# Patient Record
Sex: Female | Born: 1965 | Race: White | Hispanic: No | Smoking: Never smoker
Health system: Southern US, Community
[De-identification: ages and names within clinical notes are randomized; demographics above are authoritative.]

## PROBLEM LIST (undated history)

## (undated) DIAGNOSIS — I10 Essential (primary) hypertension: Secondary | ICD-10-CM

## (undated) DIAGNOSIS — K219 Gastro-esophageal reflux disease without esophagitis: Secondary | ICD-10-CM

## (undated) DIAGNOSIS — F419 Anxiety disorder, unspecified: Secondary | ICD-10-CM

## (undated) DIAGNOSIS — G43909 Migraine, unspecified, not intractable, without status migrainosus: Secondary | ICD-10-CM

## (undated) DIAGNOSIS — R011 Cardiac murmur, unspecified: Secondary | ICD-10-CM

## (undated) DIAGNOSIS — J329 Chronic sinusitis, unspecified: Secondary | ICD-10-CM

## (undated) DIAGNOSIS — A6000 Herpesviral infection of urogenital system, unspecified: Secondary | ICD-10-CM

## (undated) DIAGNOSIS — I499 Cardiac arrhythmia, unspecified: Secondary | ICD-10-CM

## (undated) DIAGNOSIS — A0472 Enterocolitis due to Clostridium difficile, not specified as recurrent: Secondary | ICD-10-CM

## (undated) DIAGNOSIS — F32A Depression, unspecified: Secondary | ICD-10-CM

## (undated) HISTORY — PX: BREAST CYST ASPIRATION: SHX578

## (undated) HISTORY — PX: UPPER GASTROINTESTINAL ENDOSCOPY: SHX188

## (undated) HISTORY — DX: Cardiac arrhythmia, unspecified: I49.9

## (undated) HISTORY — DX: Herpesviral infection of urogenital system, unspecified: A60.00

## (undated) HISTORY — DX: Cardiac murmur, unspecified: R01.1

## (undated) HISTORY — PX: COLONOSCOPY: SHX174

## (undated) HISTORY — DX: Migraine, unspecified, not intractable, without status migrainosus: G43.909

## (undated) HISTORY — DX: Essential (primary) hypertension: I10

## (undated) HISTORY — DX: Anxiety disorder, unspecified: F41.9

## (undated) HISTORY — DX: Gastro-esophageal reflux disease without esophagitis: K21.9

## (undated) HISTORY — DX: Enterocolitis due to Clostridium difficile, not specified as recurrent: A04.72

## (undated) HISTORY — DX: Chronic sinusitis, unspecified: J32.9

## (undated) HISTORY — DX: Depression, unspecified: F32.A

---

## 1998-05-09 ENCOUNTER — Other Ambulatory Visit: Admission: RE | Admit: 1998-05-09 | Discharge: 1998-05-09 | Payer: Self-pay | Admitting: Obstetrics and Gynecology

## 1999-06-13 ENCOUNTER — Other Ambulatory Visit: Admission: RE | Admit: 1999-06-13 | Discharge: 1999-06-13 | Payer: Self-pay | Admitting: Obstetrics and Gynecology

## 2000-01-07 ENCOUNTER — Encounter: Payer: Self-pay | Admitting: *Deleted

## 2000-01-07 ENCOUNTER — Encounter: Admission: RE | Admit: 2000-01-07 | Discharge: 2000-01-07 | Payer: Self-pay | Admitting: *Deleted

## 2000-06-15 ENCOUNTER — Other Ambulatory Visit: Admission: RE | Admit: 2000-06-15 | Discharge: 2000-06-15 | Payer: Self-pay | Admitting: Obstetrics and Gynecology

## 2000-10-06 ENCOUNTER — Encounter: Payer: Self-pay | Admitting: Obstetrics and Gynecology

## 2000-10-06 ENCOUNTER — Encounter: Admission: RE | Admit: 2000-10-06 | Discharge: 2000-10-06 | Payer: Self-pay | Admitting: Obstetrics and Gynecology

## 2001-06-21 ENCOUNTER — Other Ambulatory Visit: Admission: RE | Admit: 2001-06-21 | Discharge: 2001-06-21 | Payer: Self-pay | Admitting: Obstetrics and Gynecology

## 2001-11-05 ENCOUNTER — Encounter: Admission: RE | Admit: 2001-11-05 | Discharge: 2001-11-05 | Payer: Self-pay | Admitting: Obstetrics and Gynecology

## 2001-11-05 ENCOUNTER — Encounter: Payer: Self-pay | Admitting: Obstetrics and Gynecology

## 2002-10-07 ENCOUNTER — Encounter: Payer: Self-pay | Admitting: Obstetrics and Gynecology

## 2002-10-07 ENCOUNTER — Encounter: Admission: RE | Admit: 2002-10-07 | Discharge: 2002-10-07 | Payer: Self-pay | Admitting: Obstetrics and Gynecology

## 2002-11-16 ENCOUNTER — Other Ambulatory Visit: Admission: RE | Admit: 2002-11-16 | Discharge: 2002-11-16 | Payer: Self-pay | Admitting: Obstetrics and Gynecology

## 2003-02-14 ENCOUNTER — Encounter: Payer: Self-pay | Admitting: Obstetrics and Gynecology

## 2003-02-14 ENCOUNTER — Encounter: Admission: RE | Admit: 2003-02-14 | Discharge: 2003-02-14 | Payer: Self-pay | Admitting: Obstetrics and Gynecology

## 2006-03-23 ENCOUNTER — Encounter: Admission: RE | Admit: 2006-03-23 | Discharge: 2006-03-23 | Payer: Self-pay | Admitting: Specialist

## 2007-04-09 ENCOUNTER — Encounter: Admission: RE | Admit: 2007-04-09 | Discharge: 2007-04-09 | Payer: Self-pay | Admitting: Specialist

## 2008-03-25 ENCOUNTER — Emergency Department (HOSPITAL_COMMUNITY): Admission: EM | Admit: 2008-03-25 | Discharge: 2008-03-25 | Payer: Self-pay | Admitting: Emergency Medicine

## 2009-01-23 ENCOUNTER — Encounter: Admission: RE | Admit: 2009-01-23 | Discharge: 2009-01-23 | Payer: Self-pay | Admitting: Specialist

## 2010-01-31 ENCOUNTER — Encounter: Admission: RE | Admit: 2010-01-31 | Discharge: 2010-01-31 | Payer: Self-pay | Admitting: Specialist

## 2010-08-04 ENCOUNTER — Encounter: Payer: Self-pay | Admitting: Specialist

## 2011-02-04 ENCOUNTER — Other Ambulatory Visit: Payer: Self-pay | Admitting: Specialist

## 2011-02-04 DIAGNOSIS — Z1231 Encounter for screening mammogram for malignant neoplasm of breast: Secondary | ICD-10-CM

## 2011-02-19 ENCOUNTER — Ambulatory Visit
Admission: RE | Admit: 2011-02-19 | Discharge: 2011-02-19 | Disposition: A | Discharge status: Home / Self Care | Payer: BC Managed Care – PPO | Source: Ambulatory Visit | Attending: Specialist | Admitting: Specialist

## 2011-02-19 DIAGNOSIS — Z1231 Encounter for screening mammogram for malignant neoplasm of breast: Secondary | ICD-10-CM

## 2012-02-02 ENCOUNTER — Other Ambulatory Visit: Payer: Self-pay | Admitting: Specialist

## 2012-02-02 DIAGNOSIS — Z1231 Encounter for screening mammogram for malignant neoplasm of breast: Secondary | ICD-10-CM

## 2012-02-20 ENCOUNTER — Ambulatory Visit
Admission: RE | Admit: 2012-02-20 | Discharge: 2012-02-20 | Disposition: A | Discharge status: Home / Self Care | Payer: BC Managed Care – PPO | Source: Ambulatory Visit | Attending: Specialist | Admitting: Specialist

## 2012-02-20 DIAGNOSIS — Z1231 Encounter for screening mammogram for malignant neoplasm of breast: Secondary | ICD-10-CM

## 2013-02-07 ENCOUNTER — Other Ambulatory Visit: Payer: Self-pay

## 2013-02-07 DIAGNOSIS — Z1231 Encounter for screening mammogram for malignant neoplasm of breast: Secondary | ICD-10-CM

## 2013-06-10 ENCOUNTER — Ambulatory Visit: Payer: BC Managed Care – PPO

## 2017-10-19 ENCOUNTER — Other Ambulatory Visit: Payer: Self-pay | Admitting: Specialist

## 2017-10-19 DIAGNOSIS — Z139 Encounter for screening, unspecified: Secondary | ICD-10-CM

## 2017-10-21 ENCOUNTER — Ambulatory Visit
Admission: RE | Admit: 2017-10-21 | Discharge: 2017-10-21 | Disposition: A | Discharge status: Home / Self Care | Payer: BLUE CROSS/BLUE SHIELD | Source: Ambulatory Visit | Attending: Specialist | Admitting: Specialist

## 2017-10-21 DIAGNOSIS — Z139 Encounter for screening, unspecified: Secondary | ICD-10-CM

## 2018-01-05 ENCOUNTER — Other Ambulatory Visit: Payer: Self-pay | Admitting: Dentistry

## 2018-01-05 DIAGNOSIS — R29898 Other symptoms and signs involving the musculoskeletal system: Secondary | ICD-10-CM

## 2018-01-21 ENCOUNTER — Other Ambulatory Visit (HOSPITAL_COMMUNITY): Payer: Self-pay | Admitting: Dentistry

## 2018-01-21 DIAGNOSIS — R29898 Other symptoms and signs involving the musculoskeletal system: Secondary | ICD-10-CM

## 2018-01-29 ENCOUNTER — Ambulatory Visit (HOSPITAL_COMMUNITY)
Admission: RE | Admit: 2018-01-29 | Discharge: 2018-01-29 | Disposition: A | Discharge status: Home / Self Care | Payer: BLUE CROSS/BLUE SHIELD | Source: Ambulatory Visit | Attending: Dentistry | Admitting: Dentistry

## 2018-01-29 DIAGNOSIS — M2669 Other specified disorders of temporomandibular joint: Secondary | ICD-10-CM | POA: Insufficient documentation

## 2018-01-29 DIAGNOSIS — R29898 Other symptoms and signs involving the musculoskeletal system: Secondary | ICD-10-CM | POA: Diagnosis present

## 2018-01-29 DIAGNOSIS — S0301XA Dislocation of jaw, right side, initial encounter: Secondary | ICD-10-CM | POA: Insufficient documentation

## 2018-01-29 DIAGNOSIS — X58XXXA Exposure to other specified factors, initial encounter: Secondary | ICD-10-CM | POA: Diagnosis not present

## 2018-09-15 ENCOUNTER — Other Ambulatory Visit: Payer: Self-pay | Admitting: Family

## 2018-09-15 DIAGNOSIS — Z1231 Encounter for screening mammogram for malignant neoplasm of breast: Secondary | ICD-10-CM

## 2018-10-25 ENCOUNTER — Ambulatory Visit: Payer: BLUE CROSS/BLUE SHIELD

## 2019-01-24 ENCOUNTER — Ambulatory Visit
Admission: RE | Admit: 2019-01-24 | Discharge: 2019-01-24 | Disposition: A | Discharge status: Home / Self Care | Payer: BC Managed Care – PPO | Source: Ambulatory Visit | Attending: Family | Admitting: Family

## 2019-01-24 DIAGNOSIS — Z1231 Encounter for screening mammogram for malignant neoplasm of breast: Secondary | ICD-10-CM

## 2019-01-30 ENCOUNTER — Telehealth: Payer: BC Managed Care – PPO | Admitting: Nurse Practitioner

## 2019-01-30 DIAGNOSIS — Z20822 Contact with and (suspected) exposure to covid-19: Secondary | ICD-10-CM

## 2019-01-30 MED ORDER — ALBUTEROL SULFATE HFA 108 (90 BASE) MCG/ACT IN AERS: 2.0000 | INHALATION_SPRAY | Freq: Four times a day (QID) | RESPIRATORY_TRACT | 0 refills | Status: DC | PRN

## 2019-01-30 NOTE — Addendum Note (Signed)
Addended by: Chevis Pretty on: 01/30/2019 12:01 PM   Modules accepted: Orders

## 2019-01-30 NOTE — Progress Notes (Signed)
5-10 minutes spent reviewing and documenting in chart.  

## 2019-01-30 NOTE — Progress Notes (Signed)
E-Visit for Corona Virus Screening   Your current symptoms could be consistent with the coronavirus.  Many health care providers can now test patients at their office but not all are.  [facility] has multiple testing sites. For information on our COVID testing locations and hours go to [email or web address]  Please quarantine yourself while awaiting your test results.    COVID-19 is a respiratory illness with symptoms that aresimilar to the flu. Symptoms are typically mild to moderate, but there have been cases of severe illness and death due to the virus. The following symptoms may appear 2-14 days after exposure: Fever Cough Shortness of breath or difficulty breathing Chills Repeated shaking with chills Muscle pain Headache Sore throat New loss of taste or smell Fatigue Congestion or runny nose Nausea or vomiting Diarrhea  It is vitally important that if you feel that you have an infection such as this virus or any other virus that you stay home and away from places where you may spread it to others.  You should self-quarantine for 14 days if you have symptoms that could potentially be coronavirus or have been in close contact a with a person diagnosed with COVID-19 within the last 2 weeks. You should avoid contact with people age 53 and older.   You should wear a mask or cloth face covering over your nose and mouth if you must be around other people or animals, including pets (even at home). Try to stay at least 6 feet away from other people. This will protect the people around you.  You can use medication such as A prescription inhaler called Albuterol MDI 90 mcg /actuation 2 puffs every 4 hours as needed for shortness of breath, wheezing, cough  You may also take acetaminophen (Tylenol) as needed for fever.   Reduce your risk of any infection by using the same precautions used for avoiding the common cold or flu: Wash your hands often with  soap and warm water for at least 20 seconds.  If soap and water are not readily available, use an alcohol-based hand sanitizer with at least 60% alcohol.  If coughing or sneezing, cover your mouth and nose by coughing or sneezing into the elbow areas of your shirt or coat, into a tissue or into your sleeve (not your hands). Avoid shaking hands with others and consider head nods or verbal greetings only.  Avoid touching your eyes,nose, or mouth with unwashed hands. Avoid close contact with people who are sick. Avoid places or events with large numbers of people in one location, like concerts or sporting events. Carefully consider travel plans you have or are making. If you are planning any travel outside or inside the [place], visit the CDC'sTravelers' Health webpagefor the latest health notices. If you have some symptoms but not all symptoms, continue to monitor at home and seek medical attention if your symptoms worsen. If you are having a medical emergency, call 911.  HOME CARE Only take medications as instructed by your medical team. Drink plenty of fluids and get plenty of rest. A steam or ultrasonic humidifier can help if you have congestion.   GET HELP RIGHT AWAY IF YOU HAVE EMERGENCY WARNING SIGNS** FOR COVID-19. If you or someone is showing any of these signs seek emergency medical care immediately. Call 911 or proceed to your closest emergency facility if: You develop worsening high fever. Trouble breathing Bluish lips or face Persistent pain or pressure in the chest New confusion Inability to wake or stay awake You  cough up blood. Your symptoms become more severe  **This list is not all possible symptoms. Contact your medical provider for any symptoms that are sever or concerning to you.   MAKE SURE YOU  Understand these instructions. Will watch your condition. Will get help right away if you are not doing well or get worse.  Your e-visit answers were reviewed by a  board certified advanced clinical practitioner to complete your personal care plan.  Depending on the condition, your plan could have included both over the counter or prescription medications.  If there is a problem please reply once you have received a response from your provider.  Your safety is important to [place].  If you have drug allergies check your prescription carefully.    You can use MyChart to ask questions about today's visit, request a non-urgent call back, or ask for a work or school excuse for 24 hours related to this e-Visit. If it has been greater than 24 hours you will need to follow up with your provider, or enter a new e-Visit to address those concerns. You will get an e-mail in the next two days asking about your experience.  I hope that your e-visit has been valuable and will speed your recovery. Thank you for using e-visits.

## 2019-12-26 ENCOUNTER — Other Ambulatory Visit: Payer: Self-pay | Admitting: Family

## 2019-12-26 DIAGNOSIS — Z1231 Encounter for screening mammogram for malignant neoplasm of breast: Secondary | ICD-10-CM

## 2020-01-27 ENCOUNTER — Other Ambulatory Visit: Payer: Self-pay

## 2020-01-27 ENCOUNTER — Ambulatory Visit
Admission: RE | Admit: 2020-01-27 | Discharge: 2020-01-27 | Disposition: A | Discharge status: Home / Self Care | Payer: BC Managed Care – PPO | Source: Ambulatory Visit | Attending: Family | Admitting: Family

## 2020-01-27 DIAGNOSIS — Z1231 Encounter for screening mammogram for malignant neoplasm of breast: Secondary | ICD-10-CM

## 2020-07-16 IMAGING — MG DIGITAL SCREENING BILATERAL MAMMOGRAM WITH TOMO AND CAD
8 series · 8 of 24 positions shown · non-contrast
Comparison: Previous exam(s).

CLINICAL DATA: Screening.

EXAM:
DIGITAL SCREENING BILATERAL MAMMOGRAM WITH TOMO AND CAD

[R MLO synth-2D]
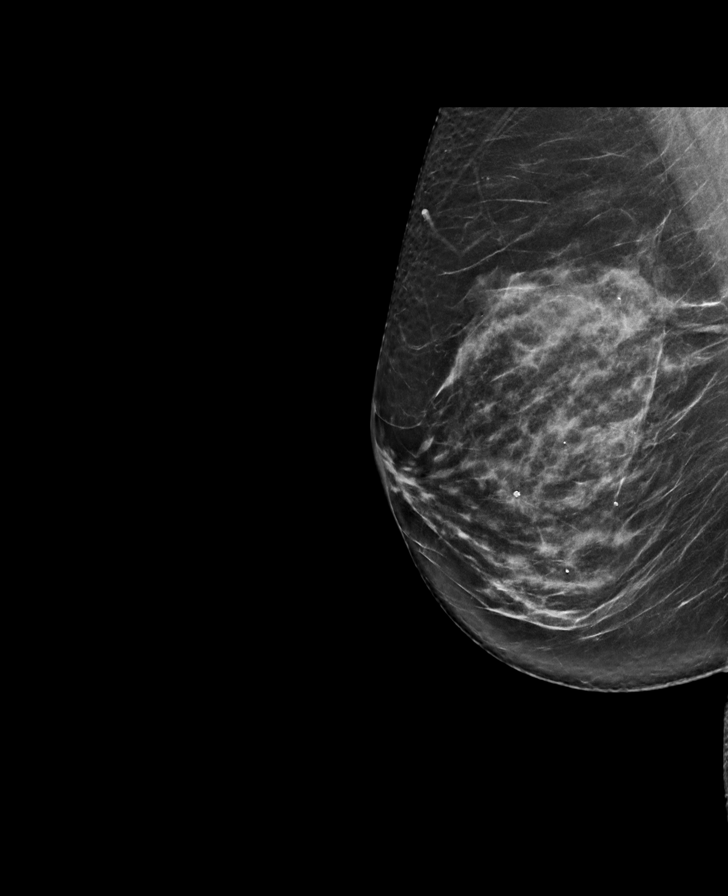

[L MLO synth-2D]
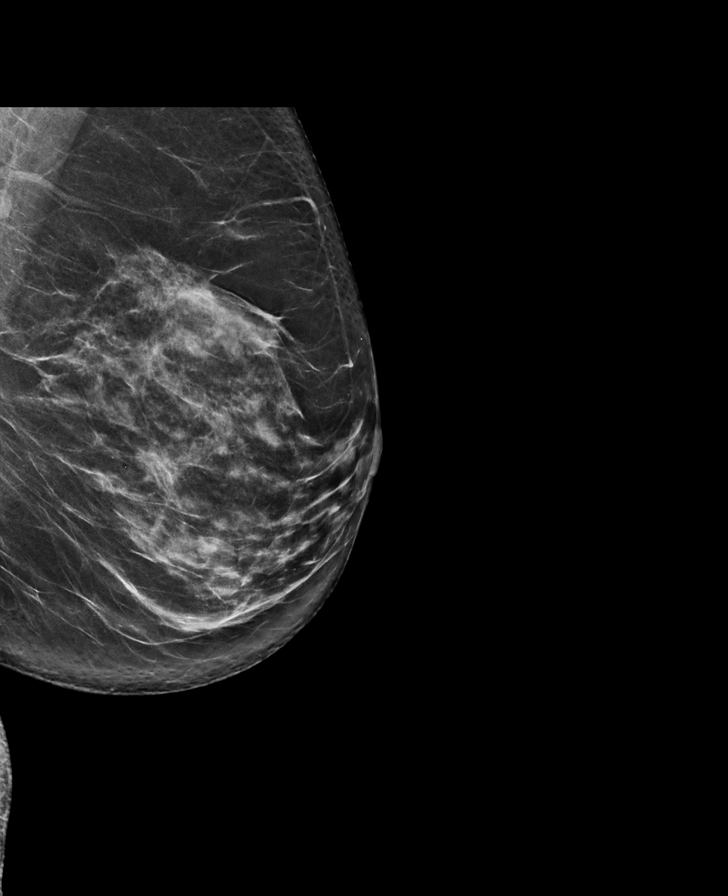

[R CC synth-2D]
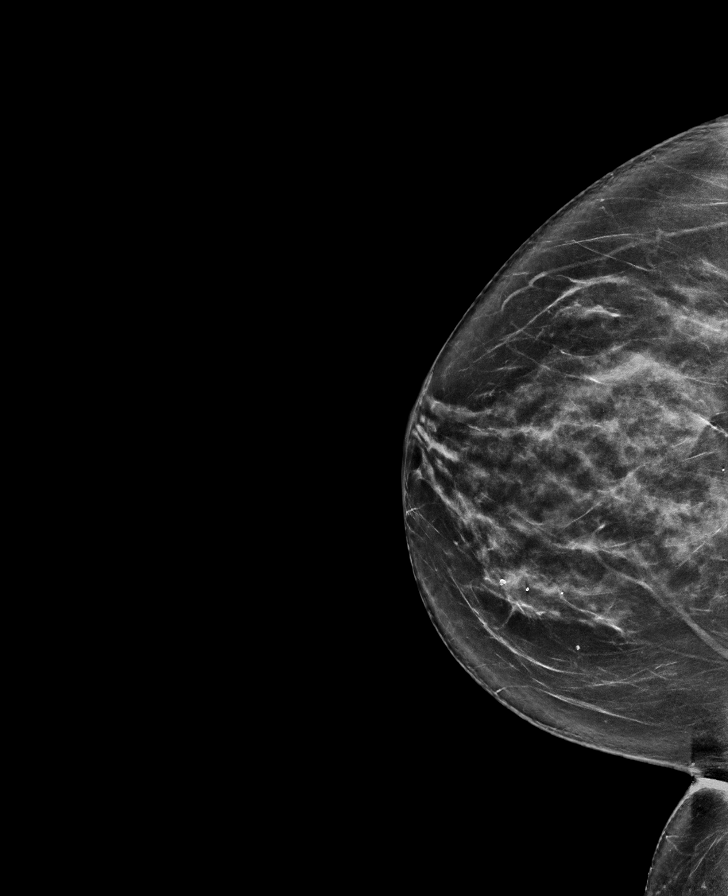

[L CC synth-2D]
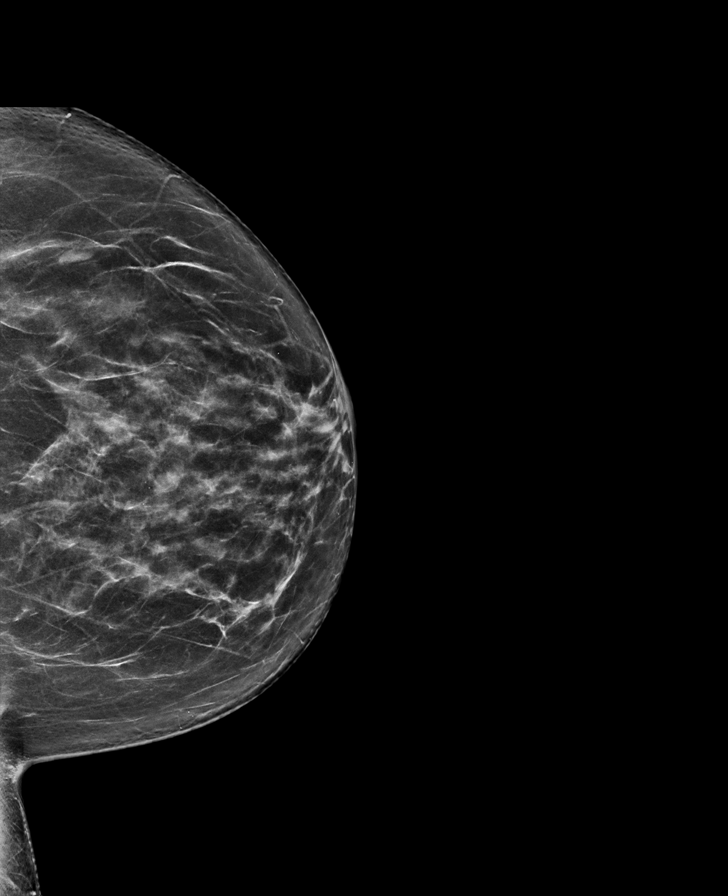

[R MLO tomo · tomo slice 41/81.0]
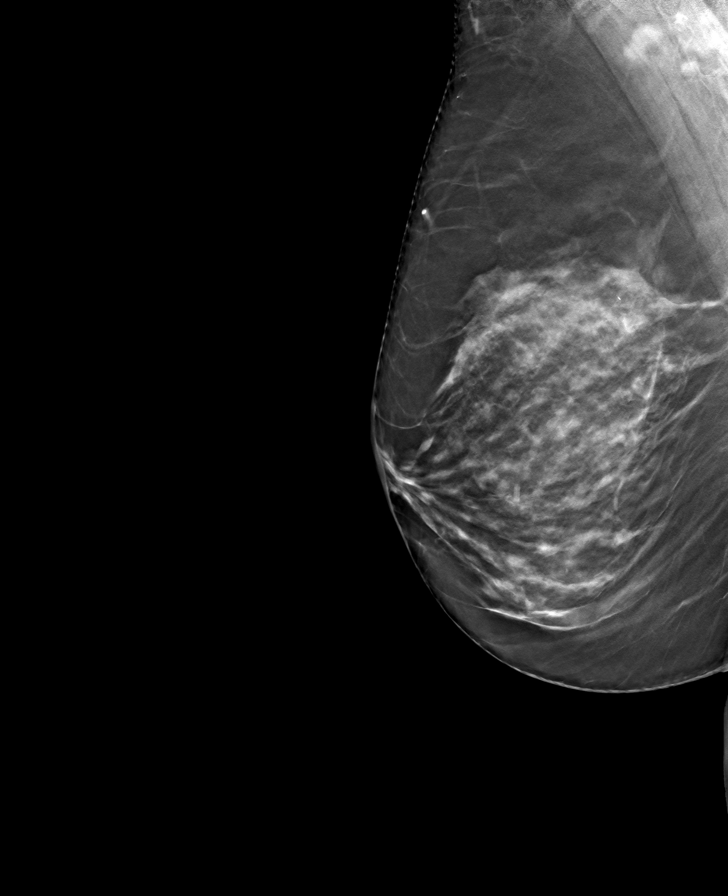

[R CC tomo · tomo slice 41/80.0]
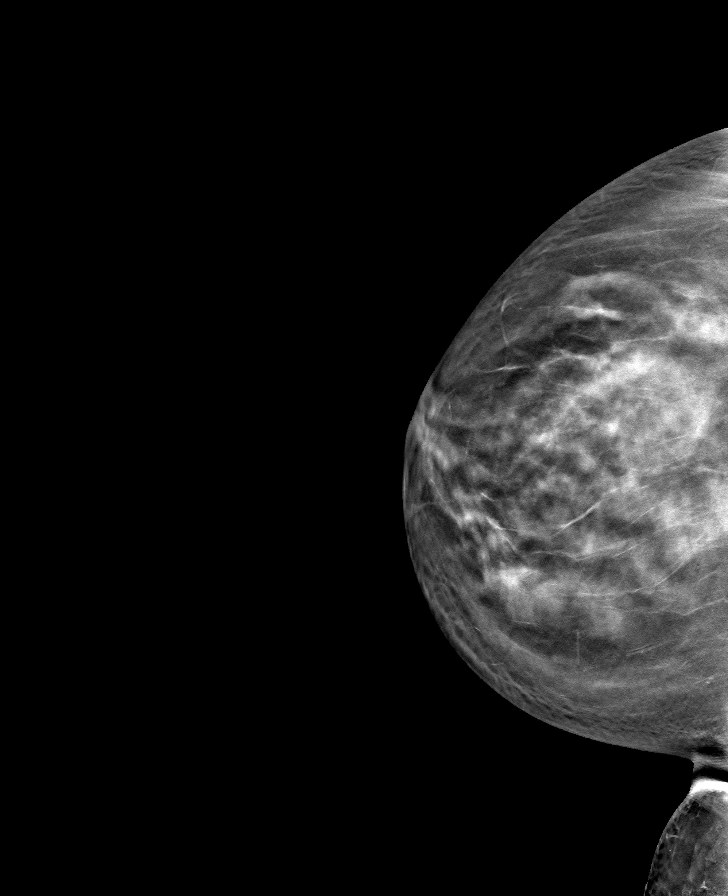

[L MLO tomo · tomo slice 41/82.0]
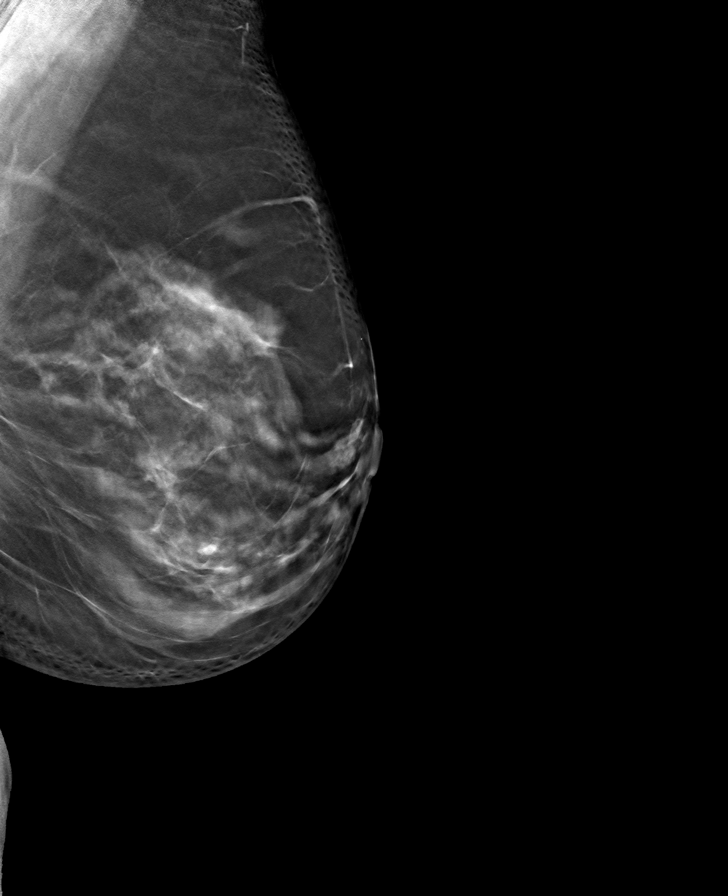

[L CC tomo · tomo slice 38/75.0]
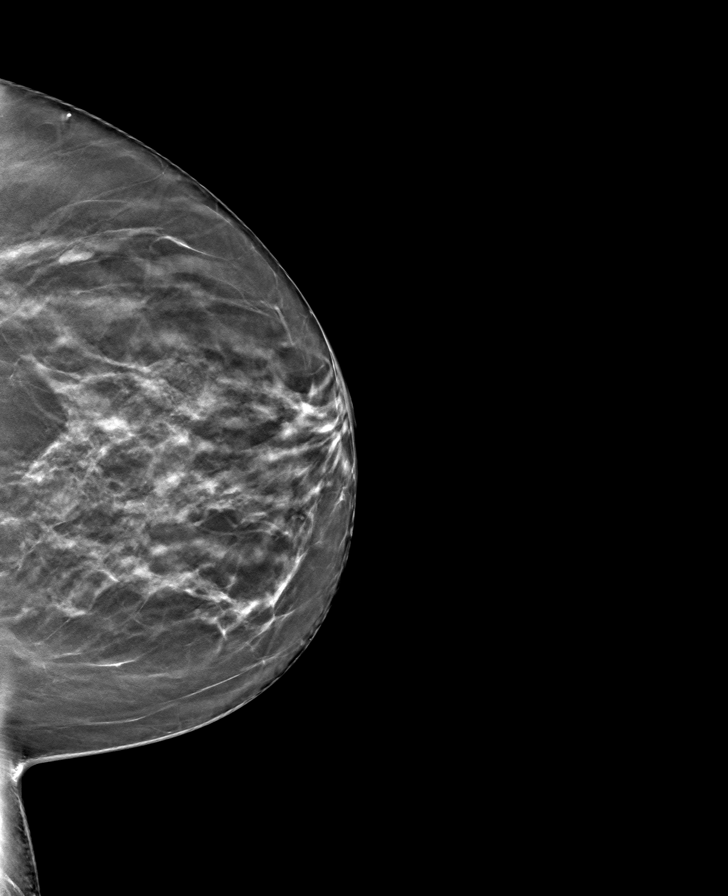

[8 of 24 positions shown; findings below may reference images not displayed]

ACR Breast Density Category c: The breast tissue is heterogeneously
dense, which may obscure small masses.
FINDINGS: There are no findings suspicious for malignancy. Images were
processed with CAD.
IMPRESSION: No mammographic evidence of malignancy. A result letter of this
screening mammogram will be mailed directly to the patient.

RECOMMENDATION:
Screening mammogram in one year. (Code:[id number])

BI-RADS CATEGORY  1: Negative.

## 2021-01-11 ENCOUNTER — Other Ambulatory Visit: Payer: Self-pay | Admitting: Family

## 2021-01-11 DIAGNOSIS — Z1231 Encounter for screening mammogram for malignant neoplasm of breast: Secondary | ICD-10-CM

## 2021-03-07 ENCOUNTER — Ambulatory Visit
Admission: RE | Admit: 2021-03-07 | Discharge: 2021-03-07 | Disposition: A | Discharge status: Home / Self Care | Payer: BC Managed Care – PPO | Source: Ambulatory Visit | Attending: Family | Admitting: Family

## 2021-03-07 ENCOUNTER — Other Ambulatory Visit: Payer: Self-pay

## 2021-03-07 DIAGNOSIS — Z1231 Encounter for screening mammogram for malignant neoplasm of breast: Secondary | ICD-10-CM

## 2022-01-11 HISTORY — PX: NASAL SINUS SURGERY: SHX719

## 2022-02-05 ENCOUNTER — Other Ambulatory Visit: Payer: Self-pay | Admitting: Family

## 2022-02-05 DIAGNOSIS — Z1231 Encounter for screening mammogram for malignant neoplasm of breast: Secondary | ICD-10-CM

## 2022-03-10 ENCOUNTER — Ambulatory Visit
Admission: RE | Admit: 2022-03-10 | Discharge: 2022-03-10 | Disposition: A | Discharge status: Home / Self Care | Payer: BC Managed Care – PPO | Source: Ambulatory Visit | Attending: Family | Admitting: Family

## 2022-03-10 DIAGNOSIS — Z1231 Encounter for screening mammogram for malignant neoplasm of breast: Secondary | ICD-10-CM

## 2022-03-14 ENCOUNTER — Emergency Department (HOSPITAL_BASED_OUTPATIENT_CLINIC_OR_DEPARTMENT_OTHER): Payer: BC Managed Care – PPO

## 2022-03-14 ENCOUNTER — Emergency Department (HOSPITAL_BASED_OUTPATIENT_CLINIC_OR_DEPARTMENT_OTHER)
Admission: EM | Admit: 2022-03-14 | Discharge: 2022-03-14 | Disposition: A | Discharge status: Home / Self Care | Payer: BC Managed Care – PPO | Attending: Emergency Medicine | Admitting: Emergency Medicine

## 2022-03-14 ENCOUNTER — Encounter (HOSPITAL_BASED_OUTPATIENT_CLINIC_OR_DEPARTMENT_OTHER): Payer: Self-pay | Admitting: Emergency Medicine

## 2022-03-14 ENCOUNTER — Other Ambulatory Visit: Payer: Self-pay

## 2022-03-14 DIAGNOSIS — I10 Essential (primary) hypertension: Secondary | ICD-10-CM | POA: Insufficient documentation

## 2022-03-14 DIAGNOSIS — U071 COVID-19: Secondary | ICD-10-CM | POA: Diagnosis not present

## 2022-03-14 DIAGNOSIS — R0789 Other chest pain: Secondary | ICD-10-CM | POA: Diagnosis present

## 2022-03-14 DIAGNOSIS — D72829 Elevated white blood cell count, unspecified: Secondary | ICD-10-CM | POA: Diagnosis not present

## 2022-03-14 HISTORY — DX: Essential (primary) hypertension: I10

## 2022-03-14 LAB — BASIC METABOLIC PANEL
Anion gap: 9 (ref 5–15)
BUN: 7 mg/dL (ref 6–20)
CO2: 21 mmol/L — ABNORMAL LOW (ref 22–32)
Calcium: 8.4 mg/dL — ABNORMAL LOW (ref 8.9–10.3)
Chloride: 100 mmol/L (ref 98–111)
Creatinine, Ser: 0.58 mg/dL (ref 0.44–1.00)
GFR, Estimated: >60 mL/min (ref >60)
Glucose, Bld: 88 mg/dL (ref 70–99)
Potassium: 3.4 mmol/L — ABNORMAL LOW (ref 3.5–5.1)
Sodium: 130 mmol/L — ABNORMAL LOW (ref 135–145)

## 2022-03-14 LAB — CBC
HCT: 44.3 % (ref 36.0–46.0)
Hemoglobin: 15.3 g/dL — ABNORMAL HIGH (ref 12.0–15.0)
MCH: 30.5 pg (ref 26.0–34.0)
MCHC: 34.5 g/dL (ref 30.0–36.0)
MCV: 88.2 fL (ref 80.0–100.0)
Platelets: 245 10*3/uL (ref 150–400)
RBC: 5.02 MIL/uL (ref 3.87–5.11)
RDW: 12.5 % (ref 11.5–15.5)
WBC: 12.6 10*3/uL — ABNORMAL HIGH (ref 4.0–10.5)
nRBC: 0 % (ref 0.0–0.2)

## 2022-03-14 LAB — TROPONIN I (HIGH SENSITIVITY): Troponin I (High Sensitivity): 2 ng/L (ref <18)

## 2022-03-14 LAB — PREGNANCY, URINE: Preg Test, Ur: NEGATIVE

## 2022-03-14 MED ORDER — BENZONATATE 200 MG PO CAPS: 200.0000 mg | ORAL_CAPSULE | Freq: Three times a day (TID) | ORAL | 0 refills | Status: AC

## 2022-03-14 MED ORDER — IPRATROPIUM-ALBUTEROL 0.5-2.5 (3) MG/3ML IN SOLN
3.0000 mL | Freq: Once | RESPIRATORY_TRACT | Status: AC
  Administered 2022-03-14: 3 mL via RESPIRATORY_TRACT
  Filled 2022-03-14: qty 3

## 2022-03-14 MED ORDER — ALBUTEROL SULFATE HFA 108 (90 BASE) MCG/ACT IN AERS: 2.0000 | INHALATION_SPRAY | Freq: Four times a day (QID) | RESPIRATORY_TRACT | 0 refills | Status: AC | PRN

## 2022-03-14 NOTE — ED Provider Notes (Signed)
[facility] EMERGENCY DEPARTMENT Provider Note   CSN: [id number] Arrival date & time: 03/14/22  1028     History  Chief Complaint  Patient presents with   Chest Pain    Chelsea Waters is a 56 y.o. female.  56 year old female with history of hypertension presents with complaint of chest congestion, runny nose, chest tightness, + COVID test at home. Patient reports symptom onset 3 days ago, woke up last night with cental chest tightness which cleared with coughing, called PCP today who recommended ER eval. Reports a rattle in the chest for the past several weeks. Had sinus surgery in July. Is taking Mucinex without improvement, started zpack and has had 1 dose, also on Paxlovid. Denies fevers, chills, history of chronic lung disease.        Home Medications Prior to Admission medications   Medication Sig Start Date End Date Taking? Authorizing Provider  benzonatate (TESSALON) 200 MG capsule Take 1 capsule (200 mg total) by mouth every 8 (eight) hours for 10 days. 03/14/22 03/24/22 Yes Jeannie Fend, PA-C  albuterol (VENTOLIN HFA) 108 (90 Base) MCG/ACT inhaler Inhale 2 puffs into the lungs every 6 (six) hours as needed for wheezing or shortness of breath. 03/14/22   Jeannie Fend, PA-C      Allergies    Amlodipine and Cefadroxil    Review of Systems   Review of Systems Negative except as per HPI Physical Exam Updated Vital Signs BP 120/85   Pulse 86   Temp 98.3 F (36.8 C) (Oral)   Resp 18   Ht 5\' 7"  (1.702 m)   Wt 71.7 kg   LMP 02/05/2011   SpO2 99%   BMI 24.75 kg/m  Physical Exam Vitals and nursing note reviewed.  Constitutional:      General: She is not in acute distress.    Appearance: She is well-developed. She is not diaphoretic.  HENT:     Head: Normocephalic and atraumatic.  Cardiovascular:     Rate and Rhythm: Normal rate and regular rhythm.     Heart sounds: Normal heart sounds.  Pulmonary:     Effort: Pulmonary effort is normal.     Breath  sounds: No wheezing.     Comments: Coarse lung sounds on the left most appreciated on posterior aspect Abdominal:     Palpations: Abdomen is soft.     Tenderness: There is no abdominal tenderness.  Musculoskeletal:     Cervical back: Neck supple.     Right lower leg: No tenderness. No edema.     Left lower leg: No tenderness. No edema.  Skin:    General: Skin is warm and dry.     Findings: No erythema or rash.  Neurological:     Mental Status: She is alert and oriented to person, place, and time.  Psychiatric:        Behavior: Behavior normal.     ED Results / Procedures / Treatments   Labs (all labs ordered are listed, but only abnormal results are displayed) Labs Reviewed  CBC - Abnormal; Notable for the following components:      Result Value   WBC 12.6 (*)    Hemoglobin 15.3 (*)    All other components within normal limits  BASIC METABOLIC PANEL - Abnormal; Notable for the following components:   Sodium 130 (*)    Potassium 3.4 (*)    CO2 21 (*)    Calcium 8.4 (*)    All other  components within normal limits  PREGNANCY, URINE  TROPONIN I (HIGH SENSITIVITY)    EKG EKG Interpretation  Date/Time:  Friday March 14 2022 10:41:08 EDT Ventricular Rate:  83 PR Interval:  155 QRS Duration: 87 QT Interval:  360 QTC Calculation: 423 R Axis:   93 Text Interpretation: Sinus rhythm Biatrial enlargement Borderline right axis deviation No previous ECGs available Confirmed by Vanetta Mulders [phone]) on 03/14/2022 11:14:46 AM  Radiology DG Chest Port 1 View  Result Date: 03/14/2022 CLINICAL DATA:  Cough. EXAM: PORTABLE CHEST 1 VIEW COMPARISON:  None Available. FINDINGS: The heart size and mediastinal contours are within normal limits. Both lungs are clear. The visualized skeletal structures are unremarkable. IMPRESSION: No active disease. Electronically Signed   By: Annia Belt M.D.   On: 03/14/2022 10:53    Procedures Procedures    Medications Ordered in  ED Medications  ipratropium-albuterol (DUONEB) 0.5-2.5 (3) MG/3ML nebulizer solution 3 mL (3 mLs Nebulization Given 03/14/22 1125)    ED Course/ Medical Decision Making/ A&P                           Medical Decision Making Amount and/or Complexity of Data Reviewed Labs: ordered. Radiology: ordered.   This patient presents to the ED for concern of chest tightness episode last night, COVID-positive, this involves an extensive number of treatment options, and is a complaint that carries with it a high risk of complications and morbidity.  The differential diagnosis includes includes but not limited to pneumonia, arrhythmia, ACS   Co morbidities that complicate the patient evaluation  Hypertension   Additional history obtained:  External records from outside source obtained and reviewed including prior labs on file including CBC from 12/07/2021 with WBC 11.2   Lab Tests:  I Ordered, and personally interpreted labs.  The pertinent results include:  CBC with elevated WBC at 12.6; BMP without significant findings. Hcg negative; troponin negative.    Imaging Studies ordered:  I ordered imaging studies including CXR  I independently visualized and interpreted imaging which showed no acute disease I agree with the radiologist interpretation   Cardiac Monitoring: / EKG:  The patient was maintained on a cardiac monitor.  I personally viewed and interpreted the cardiac monitored which showed an underlying rhythm of: Sinus rhythm, rate 83   Problem List / ED Course / Critical interventions / Medication management  56 year old female presents with concern for chest tightness which occurred last night in the setting of positive home COVID test.  Patient is on Zithromax and Paxlovid as prescribed by her PCP for her COVID.  On exam, she is found to have coarse lung sounds on the left.  Her vitals are reviewed and are reassuring including a O2 sat of 99% on room air.  Labs are reviewed and  reassuring, chest x-ray is normal.  Patient was given a DuoNeb with improvement in symptoms as well as clearing of coarse lung sounds on the left.  Patient inquires about use of prednisone, discussed data on steroids and COVID which suggests limiting use of steroids to patient's admitted with an oxygen requirement otherwise use of steroids in patients who are COVID-positive and do not have an oxygen requirement may actually result in negative outcomes.  Recommend following up with primary care provider as needed.  Provided with prescription for Tessalon and albuterol.  Return precautions provided. I ordered medication including DuoNeb for cough Reevaluation of the patient after these medicines showed that the patient  improved.  Lung sounds improved on repeat exam I have reviewed the patients home medicines and have made adjustments as needed   Social Determinants of Health:  Has PCP   Test / Admission - Considered:  Work-up complete, patient felt stable for discharge to follow-up with PCP as needed.         Final Clinical Impression(s) / ED Diagnoses Final diagnoses:  COVID    Rx / DC Orders ED Discharge Orders          Ordered    benzonatate (TESSALON) 200 MG capsule  Every 8 hours        03/14/22 1231    albuterol (VENTOLIN HFA) 108 (90 Base) MCG/ACT inhaler  Every 6 hours PRN        03/14/22 1231              Jeannie Fend, PA-C 03/14/22 1438    Vanetta Mulders, MD 03/15/22 [phone]

## 2022-03-14 NOTE — ED Triage Notes (Signed)
Covid + x 3 days , persistent cough . Reports chest tightness started last night .  Shortness breath yet mild she said .

## 2022-03-14 NOTE — ED Notes (Signed)
Pt. O2 sat=100% while ambulation on RA in hallway.

## 2022-03-14 NOTE — Discharge Instructions (Addendum)
Tessalon as needed as prescribed. Albuterol inhaler as needed. Follow up with your doctor for recheck as needed.

## 2022-03-23 ENCOUNTER — Other Ambulatory Visit: Payer: Self-pay

## 2022-03-23 ENCOUNTER — Emergency Department (HOSPITAL_BASED_OUTPATIENT_CLINIC_OR_DEPARTMENT_OTHER): Payer: BC Managed Care – PPO

## 2022-03-23 ENCOUNTER — Encounter (HOSPITAL_BASED_OUTPATIENT_CLINIC_OR_DEPARTMENT_OTHER): Payer: Self-pay | Admitting: Emergency Medicine

## 2022-03-23 ENCOUNTER — Emergency Department (HOSPITAL_BASED_OUTPATIENT_CLINIC_OR_DEPARTMENT_OTHER)
Admission: EM | Admit: 2022-03-23 | Discharge: 2022-03-23 | Disposition: A | Discharge status: Home / Self Care | Payer: BC Managed Care – PPO | Attending: Emergency Medicine | Admitting: Emergency Medicine

## 2022-03-23 DIAGNOSIS — I10 Essential (primary) hypertension: Secondary | ICD-10-CM | POA: Insufficient documentation

## 2022-03-23 DIAGNOSIS — Z8616 Personal history of COVID-19: Secondary | ICD-10-CM | POA: Diagnosis not present

## 2022-03-23 DIAGNOSIS — R0602 Shortness of breath: Secondary | ICD-10-CM | POA: Insufficient documentation

## 2022-03-23 DIAGNOSIS — R062 Wheezing: Secondary | ICD-10-CM | POA: Diagnosis not present

## 2022-03-23 LAB — CBC WITH DIFFERENTIAL/PLATELET
Abs Immature Granulocytes: 0.01 10*3/uL (ref 0.00–0.07)
Basophils Absolute: 0.1 10*3/uL (ref 0.0–0.1)
Basophils Relative: 2 %
Eosinophils Absolute: 0.1 10*3/uL (ref 0.0–0.5)
Eosinophils Relative: 2 %
HCT: 41.2 % (ref 36.0–46.0)
Hemoglobin: 13.9 g/dL (ref 12.0–15.0)
Immature Granulocytes: 0 %
Lymphocytes Relative: 33 %
Lymphs Abs: 1.3 10*3/uL (ref 0.7–4.0)
MCH: 30.5 pg (ref 26.0–34.0)
MCHC: 33.7 g/dL (ref 30.0–36.0)
MCV: 90.5 fL (ref 80.0–100.0)
Monocytes Absolute: 0.8 10*3/uL (ref 0.1–1.0)
Monocytes Relative: 20 %
Neutro Abs: 1.7 10*3/uL (ref 1.7–7.7)
Neutrophils Relative %: 43 %
Platelets: 288 10*3/uL (ref 150–400)
RBC: 4.55 MIL/uL (ref 3.87–5.11)
RDW: 12.2 % (ref 11.5–15.5)
WBC: 3.9 10*3/uL — ABNORMAL LOW (ref 4.0–10.5)
nRBC: 0 % (ref 0.0–0.2)

## 2022-03-23 LAB — I-STAT VENOUS BLOOD GAS, ED
Acid-base deficit: 1 mmol/L (ref 0.0–2.0)
Bicarbonate: 25.2 mmol/L (ref 20.0–28.0)
Calcium, Ion: 1.11 mmol/L — ABNORMAL LOW (ref 1.15–1.40)
HCT: 41 % (ref 36.0–46.0)
Hemoglobin: 13.9 g/dL (ref 12.0–15.0)
O2 Saturation: 95 %
Potassium: 3.6 mmol/L (ref 3.5–5.1)
Sodium: 132 mmol/L — ABNORMAL LOW (ref 135–145)
TCO2: 27 mmol/L (ref 22–32)
pCO2, Ven: 48 mmHg (ref 44–60)
pH, Ven: 7.327 (ref 7.25–7.43)
pO2, Ven: 82 mmHg — ABNORMAL HIGH (ref 32–45)

## 2022-03-23 LAB — BASIC METABOLIC PANEL
Anion gap: 11 (ref 5–15)
BUN: 10 mg/dL (ref 6–20)
CO2: 24 mmol/L (ref 22–32)
Calcium: 9.2 mg/dL (ref 8.9–10.3)
Chloride: 98 mmol/L (ref 98–111)
Creatinine, Ser: 0.64 mg/dL (ref 0.44–1.00)
GFR, Estimated: >60 mL/min (ref >60)
Glucose, Bld: 113 mg/dL — ABNORMAL HIGH (ref 70–99)
Potassium: 3.7 mmol/L (ref 3.5–5.1)
Sodium: 133 mmol/L — ABNORMAL LOW (ref 135–145)

## 2022-03-23 MED ORDER — IPRATROPIUM-ALBUTEROL 0.5-2.5 (3) MG/3ML IN SOLN
3.0000 mL | RESPIRATORY_TRACT | Status: DC | PRN
  Administered 2022-03-23: 3 mL via RESPIRATORY_TRACT
  Filled 2022-03-23: qty 3

## 2022-03-23 NOTE — Progress Notes (Signed)
RT in to assess pt for shortness of breath. Pt able to talk in full sentences at this time. SpO2 100% on room air, HR 84 RR 18. Bilateral breath sounds with faint exp wheezes and rhonchi. Pt endorses cough with small production of clear phlegm. No distress noted at this time. Ambulatory SpO2 100%. RT will continue to monitor and be available as needed.

## 2022-03-23 NOTE — ED Triage Notes (Signed)
Covid  positive Tuesday August 29 .Patient reports coughing  Reports phlegm that is clear. Reports shortness of breath .

## 2022-03-23 NOTE — Progress Notes (Signed)
VBG results given to Dr. Doran Durand. No new orders received at this time. RT will continue to monitor.

## 2022-03-23 NOTE — Progress Notes (Signed)
Incentive spirometer and flutter valve provided to patient at this time. Pt verbalizes understanding and it able to perform well. achieved x10 and flutter x10. Pt tolerated and performed without complication.

## 2022-03-23 NOTE — ED Provider Notes (Signed)
[facility] EMERGENCY DEPARTMENT Provider Note   CSN: [id number] Arrival date & time: 03/23/22  1012     History Chief Complaint  Patient presents with   Shortness of Breath    HPI Chelsea Waters is a 56 y.o. female presenting for shortness of breath.  She has a 79-month long history of ongoing morning sputum production with cough, wheezing, shortness of breath.  She is started using her husband's inhalers with some symptomatic improvement but presents for second opinion.  She denies fevers or chills nausea vomiting syncope or chest pain.  She recently had a COVID infection and feels that that has progressed her disease as well.  She is able to get around the house but feels more fatigued at work. She is requesting referral to pulmonology as she has been unable to obtain outpatient follow-up.  Patient's recorded medical, surgical, social, medication list and allergies were reviewed in the Snapshot window as part of the initial history.   Review of Systems   Review of Systems  Constitutional:  Negative for chills and fever.  HENT:  Negative for ear pain and sore throat.   Eyes:  Negative for pain and visual disturbance.  Respiratory:  Positive for cough and wheezing. Negative for shortness of breath.   Cardiovascular:  Negative for chest pain and palpitations.  Gastrointestinal:  Negative for abdominal pain and vomiting.  Genitourinary:  Negative for dysuria and hematuria.  Musculoskeletal:  Negative for arthralgias and back pain.  Skin:  Negative for color change and rash.  Neurological:  Negative for seizures and syncope.  All other systems reviewed and are negative.   Physical Exam Updated Vital Signs BP 139/75   Pulse 75   Temp 98.2 F (36.8 C)   Resp 18   Ht 5\' 7"  (1.702 m)   Wt 70.8 kg   LMP 02/05/2011   SpO2 100%   BMI 24.43 kg/m  Physical Exam Vitals and nursing note reviewed.  Constitutional:      General: She is not in acute distress.     Appearance: She is well-developed.  HENT:     Head: Normocephalic and atraumatic.  Eyes:     Conjunctiva/sclera: Conjunctivae normal.  Cardiovascular:     Rate and Rhythm: Normal rate and regular rhythm.     Heart sounds: No murmur heard. Pulmonary:     Effort: Pulmonary effort is normal. No respiratory distress.     Breath sounds: Wheezing and rhonchi present.  Abdominal:     General: There is no distension.     Palpations: Abdomen is soft.     Tenderness: There is no abdominal tenderness. There is no right CVA tenderness or left CVA tenderness.  Musculoskeletal:        General: No swelling or tenderness. Normal range of motion.     Cervical back: Neck supple.  Skin:    General: Skin is warm and dry.  Neurological:     General: No focal deficit present.     Mental Status: She is alert and oriented to person, place, and time. Mental status is at baseline.     Cranial Nerves: No cranial nerve deficit.      ED Course/ Medical Decision Making/ A&P    Procedures Procedures   Medications Ordered in ED Medications  ipratropium-albuterol (DUONEB) 0.5-2.5 (3) MG/3ML nebulizer solution 3 mL (3 mLs Nebulization Given 03/23/22 1150)    Medical Decision Making:    ROSHAWNA Waters is a 56 y.o. female who presented to  the ED today with acute on chronic shortness of breath detailed above.     Patient's presentation is complicated by their history of hypertension on outpatient medications, chronic wheezing with acute worsening.  Patient placed on continuous vitals and telemetry monitoring while in ED which was reviewed periodically.   Complete initial physical exam performed, notably the patient  was hemodynamically stable in no acute distress.  She does have prolonged expiratory phase appreciated on physical exam with end expiratory wheezes that cleared with DuoNeb therapies and mucus clearance therapies.      Reviewed and confirmed nursing documentation for past medical history,  family history, social history.    Initial Assessment:   Patient's history present on symptoms exam findings are most consistent with chronic dyspnea of uncertain etiology.  Considered ACS, pulmonary embolism, pneumonia though these seem less likely at this time.  She does report multiple concerning findings for COPD including morning mucus production, wheezing and improvement with her significant other's budesonide/formoterol inhaler. Ultimately she is ambulatory tolerating p.o. intake and in no acute distress.  We will evaluate patient today for more significant infection or abnormality with CBC, CMP, VBG, x-ray, EKG.  All of this was performed and grossly reassuring at this time.  No evidence of acute pathology appreciated.  VBG does demonstrate top of normal CO2.  Patient's and expiratory wheezes have cleared after administration of DuoNeb therapy.  Will refer patient to pulmonology to coordinate outpatient care and spirometry given her prolonged expiratory phase and higher than expected CO2 on VBG.  Patient otherwise stable for continued outpatient care management without acute indication of intervention at this time.  Clinical Impression:  1. SOB (shortness of breath)      Data Unavailable   Final Clinical Impression(s) / ED Diagnoses Final diagnoses:  SOB (shortness of breath)    Rx / DC Orders ED Discharge Orders          Ordered    Ambulatory referral to Pulmonology        03/23/22 1206              Glyn Ade, MD 03/23/22 1209

## 2022-04-09 ENCOUNTER — Encounter: Payer: Self-pay | Admitting: Pulmonary Disease

## 2022-04-09 ENCOUNTER — Ambulatory Visit (INDEPENDENT_AMBULATORY_CARE_PROVIDER_SITE_OTHER): Payer: BC Managed Care – PPO | Admitting: Pulmonary Disease

## 2022-04-09 VITALS — BP 118/76 | HR 87 | Temp 98.2°F | Ht 67.0 in | Wt 160.0 lb

## 2022-04-09 DIAGNOSIS — R0609 Other forms of dyspnea: Secondary | ICD-10-CM | POA: Diagnosis not present

## 2022-04-09 MED ORDER — BUDESONIDE-FORMOTEROL FUMARATE 80-4.5 MCG/ACT IN AERO: 2.0000 | INHALATION_SPRAY | Freq: Two times a day (BID) | RESPIRATORY_TRACT | 3 refills | Status: DC

## 2022-04-09 NOTE — Progress Notes (Signed)
Chelsea Waters    [id number]    October 09, 1965  Primary Care Physician:Pomposini, Cherly Anderson, MD  Referring Physician: Tretha Sciara, MD [address] [place],  [place] [address]  Chief complaint:   Cough, rattling in the chest for about 3 years  HPI:  Cough rattling in the chest for about 3 years Was using albuterol as needed  Recently had COVID about a month ago, symptoms did get significantly worse and was using albuterol almost 3-4 times a day Symptoms have improved  Had intervention for chronic sinusitis about 2 months ago  Coughing with sputum expectoration most mornings No fevers or chills  Tolerating activities well with no significant limitations  Never smoker, no exposure to significant secondhand smoke  Works as a Pharmacist, hospital  History of hypertension, cardiac rhythm problems, allergy and sinus trouble, chronic headaches  Outpatient Encounter Medications as of 04/09/2022  Medication Sig   acetaminophen (TYLENOL) 325 MG tablet Take by mouth.   albuterol (VENTOLIN HFA) 108 (90 Base) MCG/ACT inhaler Inhale 2 puffs into the lungs every 6 (six) hours as needed for wheezing or shortness of breath.   busPIRone (BUSPAR) 15 MG tablet Take 15 mg by mouth 3 (three) times daily.   famotidine (PEPCID) 20 MG tablet Take 20 mg by mouth daily.   gabapentin (NEURONTIN) 600 MG tablet Take 600 mg by mouth 3 (three) times daily.   ipratropium (ATROVENT) 0.06 % nasal spray Place into both nostrils.   irbesartan-hydrochlorothiazide (AVALIDE) 300-12.5 MG tablet Take 1 tablet by mouth daily.   montelukast (SINGULAIR) 10 MG tablet Take 10 mg by mouth daily.   omeprazole (PRILOSEC) 20 MG capsule Take 20 mg by mouth daily.   rizatriptan (MAXALT) 10 MG tablet Take by mouth.   Vilazodone HCl (VIIBRYD) 40 MG TABS Take 40 mg by mouth.   [DISCONTINUED] amoxicillin-clavulanate (AUGMENTIN) 875-125 MG tablet Take 1 tablet by mouth 2 (two) times daily. (Patient not taking: Reported on 04/09/2022)    [DISCONTINUED] budesonide (PULMICORT) 0.5 MG/2ML nebulizer solution Take by nebulization. (Patient not taking: Reported on 04/09/2022)   [DISCONTINUED] fluconazole (DIFLUCAN) 100 MG tablet Take 100 mg by mouth daily. (Patient not taking: Reported on 04/09/2022)   [DISCONTINUED] nystatin (MYCOSTATIN) 100000 UNIT/ML suspension Take 5 mLs by mouth 4 (four) times daily. (Patient not taking: Reported on 04/09/2022)   [DISCONTINUED] PAXLOVID, 300/100, 20 x 150 MG & 10 x 100MG  TBPK Take 3 tablets by mouth 2 (two) times daily. (Patient not taking: Reported on 04/09/2022)   No facility-administered encounter medications on file as of 04/09/2022.    Allergies as of 04/09/2022 - Review Complete 04/09/2022  Allergen Reaction Noted   Amlodipine Other (See Comments) 03/14/2022   Cefadroxil Diarrhea, Nausea And Vomiting, and Other (See Comments) 03/14/2013    Past Medical History:  Diagnosis Date   Hypertension     Past Surgical History:  Procedure Laterality Date   NASAL SINUS SURGERY  01/2022    Family History  Problem Relation Age of Onset   Diabetes Mother    Hypertension Mother    Breast cancer Mother 81   Diabetes Father    Hypertension Father    Breast cancer Maternal Aunt    Alpha-1 antitrypsin deficiency Neg Hx    Ovarian cancer Neg Hx     Social History   Socioeconomic History   Marital status: Divorced    Spouse name: Not on file   Number of children: Not on file   Years of  education: Not on file   Highest education level: Not on file  Occupational History   Not on file  Tobacco Use   Smoking status: Never    Passive exposure: Never   Smokeless tobacco: Never  Substance and Sexual Activity   Alcohol use: Never   Drug use: Never   Sexual activity: Not Currently    Partners: Male  Other Topics Concern   Not on file  Social History Narrative   Not on file   Social Determinants of Health   Financial Resource Strain: Not on file  Food Insecurity: Not on file   Transportation Needs: Not on file  Physical Activity: Not on file  Stress: Not on file  Social Connections: Not on file  Intimate Partner Violence: Not on file    Review of Systems  Respiratory:  Positive for cough and shortness of breath.     Vitals:   04/09/22 0915  BP: 118/76  Pulse: 87  Temp: 98.2 F (36.8 C)  SpO2: 97%     Physical Exam Constitutional:      Appearance: Normal appearance.  HENT:     Head: Normocephalic.     Right Ear: Tympanic membrane normal.     Mouth/Throat:     Mouth: Mucous membranes are moist.  Cardiovascular:     Rate and Rhythm: Normal rate and regular rhythm.     Heart sounds: No murmur heard.    No friction rub.  Pulmonary:     Effort: No respiratory distress.     Breath sounds: No stridor. No wheezing or rhonchi.  Musculoskeletal:     Cervical back: No rigidity or tenderness.  Neurological:     Mental Status: She is alert.  Psychiatric:        Mood and Affect: Mood normal.    Data Reviewed: Recent chest x-ray reviewed by myself showing no acute infiltrate  Assessment:  Cough, rattling in the chest -Persistent for about 3 years -Recently worse with a COVID infection  History of sinus problems for which she had intervention about a couple of months ago  No significant smoking history of significant exposure to secondhand smoke, no occupational predisposition  Plan/Recommendations: Schedule patient for pulmonary function test  Prescription for Symbicort  Graded exercise as tolerated  Follow-up in 6 to 8 weeks  Encouraged to call with significant concerns   Virl Diamond MD [facility] 04/09/2022, 9:19 AM  CC: Glyn Ade, MD

## 2022-04-09 NOTE — Patient Instructions (Signed)
Prescription for Symbicort was sent into pharmacy for you to be used twice a day Ensure that you rinse your mouth after use  Albuterol as needed  Obtain a pulmonary function test at next visit  Wait till about 2 months after your COVID infection to get your booster  Avoid known triggers  Let us be committed to using the inhaler for about 4 to 6 weeks and possibly after your breathing study to decide on whether to continue using it or not

## 2022-04-23 ENCOUNTER — Telehealth: Payer: Self-pay | Admitting: Pulmonary Disease

## 2022-04-23 NOTE — Telephone Encounter (Signed)
Patient called to inform the nurse or doctor that the medication prescribed, Symbicort, she believes is giving her thrush.  She would like to speak to the nurse to see what she can do.  Please call patient at [phone]

## 2022-04-23 NOTE — Telephone Encounter (Signed)
Called patient and she states that she believes that her Symbicort is causing her to have thrush. She states that she does rinse her mouth out with water after she uses it. She states that she does have nystatin but it is not helping.   Please advise sir on recommendations

## 2022-04-24 ENCOUNTER — Other Ambulatory Visit: Payer: Self-pay | Admitting: Pulmonary Disease

## 2022-04-24 MED ORDER — FLUCONAZOLE 100 MG PO TABS: 100.0000 mg | ORAL_TABLET | Freq: Every day | ORAL | 0 refills | Status: DC

## 2022-04-24 NOTE — Progress Notes (Signed)
Prescription for Diflucan called into pharmacy

## 2022-04-24 NOTE — Telephone Encounter (Signed)
Attempted to call pt but unable to reach. Left her a detailed message letting her know that [facility] sent Rx for diflucan to pharmacy for her. Nothing further needed.

## 2022-04-24 NOTE — Telephone Encounter (Signed)
Will call in a prescription for Diflucan.  The best way to reduce the incidence of developing thrush is still regular rinsing.

## 2022-06-04 ENCOUNTER — Encounter: Payer: Self-pay | Admitting: Pulmonary Disease

## 2022-06-04 ENCOUNTER — Ambulatory Visit (INDEPENDENT_AMBULATORY_CARE_PROVIDER_SITE_OTHER): Payer: BC Managed Care – PPO | Admitting: Pulmonary Disease

## 2022-06-04 ENCOUNTER — Ambulatory Visit: Payer: BC Managed Care – PPO | Admitting: Pulmonary Disease

## 2022-06-04 VITALS — BP 110/80 | HR 72 | Ht 66.5 in | Wt 161.0 lb

## 2022-06-04 DIAGNOSIS — R053 Chronic cough: Secondary | ICD-10-CM

## 2022-06-04 DIAGNOSIS — R0609 Other forms of dyspnea: Secondary | ICD-10-CM | POA: Diagnosis not present

## 2022-06-04 LAB — PULMONARY FUNCTION TEST
DL/VA % pred: 92 %
DL/VA: 3.88 ml/min/mmHg/L
DLCO cor % pred: 88 %
DLCO cor: 19.52 ml/min/mmHg
DLCO unc % pred: 88 %
DLCO unc: 19.52 ml/min/mmHg
FEF 25-75 Post: 2.5 L/sec
FEF 25-75 Pre: 1.59 L/sec
FEF2575-%Change-Post: 56 %
FEF2575-%Pred-Post: 94 %
FEF2575-%Pred-Pre: 60 %
FEV1-%Change-Post: 10 %
FEV1-%Pred-Post: 85 %
FEV1-%Pred-Pre: 76 %
FEV1-Post: 2.43 L
FEV1-Pre: 2.19 L
FEV1FVC-%Change-Post: 3 %
FEV1FVC-%Pred-Pre: 93 %
FEV6-%Change-Post: 6 %
FEV6-%Pred-Post: 88 %
FEV6-%Pred-Pre: 83 %
FEV6-Post: 3.15 L
FEV6-Pre: 2.96 L
FEV6FVC-%Change-Post: 0 %
FEV6FVC-%Pred-Post: 102 %
FEV6FVC-%Pred-Pre: 102 %
FVC-%Change-Post: 6 %
FVC-%Pred-Post: 86 %
FVC-%Pred-Pre: 81 %
FVC-Post: 3.16 L
FVC-Pre: 2.96 L
Post FEV1/FVC ratio: 77 %
Post FEV6/FVC ratio: 100 %
Pre FEV1/FVC ratio: 74 %
Pre FEV6/FVC Ratio: 100 %
RV % pred: 124 %
RV: 2.49 L
TLC % pred: 101 %
TLC: 5.46 L

## 2022-06-04 MED ORDER — IPRATROPIUM-ALBUTEROL 20-100 MCG/ACT IN AERS: 1.0000 | INHALATION_SPRAY | Freq: Four times a day (QID) | RESPIRATORY_TRACT | 3 refills | Status: DC | PRN

## 2022-06-04 NOTE — Patient Instructions (Signed)
Prescription for Combivent to be used 2 puffs up to 4 times a day  Your breathing study did show some hyperactivity but relatively normal  The inhaler should help the cough and also if there is any shortness of breath  You can reduce the use if it makes you jittery  Tentative follow-up in 4 months

## 2022-06-04 NOTE — Progress Notes (Signed)
PFT done today. 

## 2022-06-04 NOTE — Progress Notes (Signed)
Chelsea Waters    [id number]    [birth year]  Primary Care Physician:Pomposini, Rande Brunt, MD  Referring Physician: Eldridge Abrahams, MD No address on file  Chief complaint:   Cough and congestion for few years  HPI:  Was placed on Symbicort during the last visit  She did develop a yeast infection that required treatment with Diflucan  Symptoms have improved  Did have COVID a couple of months ago  Had intervention for chronic sinusitis about 3 months ago  Coughing is mostly in the mornings No fever, no chills Tolerating activities well with no significant limitations  Never smoker, no exposure to significant secondhand smoke  Works as a Runner, broadcasting/film/video  History of hypertension, cardiac rhythm problems, allergy and sinus trouble, chronic headaches  Outpatient Encounter Medications as of 06/04/2022  Medication Sig   albuterol (VENTOLIN HFA) 108 (90 Base) MCG/ACT inhaler Inhale 2 puffs into the lungs every 6 (six) hours as needed for wheezing or shortness of breath.   montelukast (SINGULAIR) 10 MG tablet Take 10 mg by mouth daily.   acetaminophen (TYLENOL) 325 MG tablet Take by mouth.   budesonide-formoterol (SYMBICORT) 80-4.5 MCG/ACT inhaler Inhale 2 puffs into the lungs every 12 (twelve) hours. (Patient not taking: Reported on 06/04/2022)   busPIRone (BUSPAR) 15 MG tablet Take 15 mg by mouth 3 (three) times daily.   famotidine (PEPCID) 20 MG tablet Take 20 mg by mouth daily.   fluconazole (DIFLUCAN) 100 MG tablet Take 1 tablet (100 mg total) by mouth daily.   gabapentin (NEURONTIN) 600 MG tablet Take 600 mg by mouth 3 (three) times daily.   ipratropium (ATROVENT) 0.06 % nasal spray Place into both nostrils. (Patient not taking: Reported on 06/04/2022)   irbesartan-hydrochlorothiazide (AVALIDE) 300-12.5 MG tablet Take 1 tablet by mouth daily.   omeprazole (PRILOSEC) 20 MG capsule Take 20 mg by mouth daily.   rizatriptan (MAXALT) 10 MG tablet Take by mouth.    Vilazodone HCl (VIIBRYD) 40 MG TABS Take 40 mg by mouth.   No facility-administered encounter medications on file as of 06/04/2022.    Allergies as of 06/04/2022 - Review Complete 06/04/2022  Allergen Reaction Noted   Amlodipine Other (See Comments) 03/14/2022   Cefadroxil Diarrhea, Nausea And Vomiting, and Other (See Comments) 03/14/2013    Past Medical History:  Diagnosis Date   Hypertension     Past Surgical History:  Procedure Laterality Date   NASAL SINUS SURGERY  01/2022    Family History  Problem Relation Age of Onset   Diabetes Mother    Hypertension Mother    Breast cancer Mother 57   Diabetes Father    Hypertension Father    Breast cancer Maternal Aunt    Alpha-1 antitrypsin deficiency Neg Hx    Ovarian cancer Neg Hx     Social History   Socioeconomic History   Marital status: Divorced    Spouse name: Not on file   Number of children: Not on file   Years of education: Not on file   Highest education level: Not on file  Occupational History   Not on file  Tobacco Use   Smoking status: Never    Passive exposure: Never   Smokeless tobacco: Never  Substance and Sexual Activity   Alcohol use: Never   Drug use: Never   Sexual activity: Not Currently    Partners: Male  Other Topics Concern   Not on file  Social History Narrative   Not  on file   Social Determinants of Health   Financial Resource Strain: Not on file  Food Insecurity: Not on file  Transportation Needs: Not on file  Physical Activity: Not on file  Stress: Not on file  Social Connections: Not on file  Intimate Partner Violence: Not on file    Review of Systems  Respiratory:  Positive for cough and shortness of breath.     Vitals:   06/04/22 1433  BP: 110/80  Pulse: 72  SpO2: 100%     Physical Exam Constitutional:      Appearance: Normal appearance.  HENT:     Head: Normocephalic.     Right Ear: Tympanic membrane normal.     Mouth/Throat:     Mouth: Mucous membranes  are moist.  Cardiovascular:     Rate and Rhythm: Normal rate and regular rhythm.     Heart sounds: No murmur heard.    No friction rub.  Pulmonary:     Effort: No respiratory distress.     Breath sounds: No stridor. No wheezing or rhonchi.  Musculoskeletal:     Cervical back: No rigidity or tenderness.  Neurological:     Mental Status: She is alert.  Psychiatric:        Mood and Affect: Mood normal.    Data Reviewed: Recent chest x-ray reviewed by myself showing no acute infiltrate  PFT shows airway hyperresponsiveness with about 10% improvement with albuterol use, probable mild obstructive disease  Assessment:  Cough, rattling in the chest -Symptoms are better  Did not tolerate Symbicort despite rinsing regularly -Developed thrush that required fluconazole  History of sinus problems for which she had intervention about a couple of months ago  No significant smoking history of significant exposure to secondhand smoke, no occupational predisposition  Plan/Recommendations: Trial with Combivent, 2 puffs up to 4 times a day  Graded activities as tolerated  Follow-up in about 4 months  Encouraged to call with significant concerns   Sherrilyn Rist MD [facility] 06/04/2022, 2:47 PM  CC: Pomposini, Cherly Anderson, MD

## 2022-10-16 ENCOUNTER — Other Ambulatory Visit: Payer: Self-pay | Admitting: Nurse Practitioner

## 2022-10-16 DIAGNOSIS — N644 Mastodynia: Secondary | ICD-10-CM

## 2022-10-27 ENCOUNTER — Ambulatory Visit
Admission: RE | Admit: 2022-10-27 | Discharge: 2022-10-27 | Disposition: A | Discharge status: Home / Self Care | Payer: BC Managed Care – PPO | Source: Ambulatory Visit | Attending: Nurse Practitioner | Admitting: Nurse Practitioner

## 2022-10-27 DIAGNOSIS — N644 Mastodynia: Secondary | ICD-10-CM

## 2022-12-05 ENCOUNTER — Telehealth: Payer: Self-pay | Admitting: Internal Medicine

## 2022-12-05 NOTE — Telephone Encounter (Signed)
Routine office follow up with me is fine

## 2022-12-05 NOTE — Telephone Encounter (Signed)
Good morning Dr. Marina Goodell,   We received a referral for this patient to schedule an appointment due to abdominal pain. Patient has previous GI history with Dr. Ave Filter at [facility]. She had a colonoscopy 03/2013 due to diarrhea. Since then patient has moved to [place], [place] and states it is easier for transportation purposes to establish GI care with [facility]. Records from previous colonoscopy were obtained and scanned into [employer] for your review. Would you please advise on scheduling?  Thank you.

## 2022-12-09 NOTE — Telephone Encounter (Signed)
LVM for patient to call back and schedule OV with Dr. Marina Goodell.

## 2022-12-13 HISTORY — PX: CHOLECYSTECTOMY: SHX55

## 2022-12-16 ENCOUNTER — Encounter: Payer: Self-pay | Admitting: Internal Medicine

## 2023-01-27 ENCOUNTER — Other Ambulatory Visit: Payer: Self-pay | Admitting: Family

## 2023-01-27 DIAGNOSIS — Z1231 Encounter for screening mammogram for malignant neoplasm of breast: Secondary | ICD-10-CM

## 2023-03-13 ENCOUNTER — Ambulatory Visit
Admission: RE | Admit: 2023-03-13 | Discharge: 2023-03-13 | Disposition: A | Discharge status: Home / Self Care | Payer: BC Managed Care – PPO | Source: Ambulatory Visit | Attending: Family | Admitting: Family

## 2023-03-13 DIAGNOSIS — Z1231 Encounter for screening mammogram for malignant neoplasm of breast: Secondary | ICD-10-CM

## 2023-03-25 ENCOUNTER — Encounter: Payer: Self-pay | Admitting: Internal Medicine

## 2023-03-25 ENCOUNTER — Other Ambulatory Visit (INDEPENDENT_AMBULATORY_CARE_PROVIDER_SITE_OTHER): Payer: BC Managed Care – PPO

## 2023-03-25 ENCOUNTER — Ambulatory Visit (INDEPENDENT_AMBULATORY_CARE_PROVIDER_SITE_OTHER): Payer: BC Managed Care – PPO | Admitting: Internal Medicine

## 2023-03-25 VITALS — BP 132/80 | HR 76 | Ht 66.5 in | Wt 163.0 lb

## 2023-03-25 DIAGNOSIS — R1011 Right upper quadrant pain: Secondary | ICD-10-CM | POA: Diagnosis not present

## 2023-03-25 DIAGNOSIS — R1013 Epigastric pain: Secondary | ICD-10-CM

## 2023-03-25 DIAGNOSIS — R7989 Other specified abnormal findings of blood chemistry: Secondary | ICD-10-CM

## 2023-03-25 DIAGNOSIS — K219 Gastro-esophageal reflux disease without esophagitis: Secondary | ICD-10-CM

## 2023-03-25 DIAGNOSIS — Z1211 Encounter for screening for malignant neoplasm of colon: Secondary | ICD-10-CM

## 2023-03-25 LAB — IBC + FERRITIN
Ferritin: 49.3 ng/mL (ref 10.0–291.0)
Iron: 65 ug/dL (ref 42–145)
Saturation Ratios: 19.5 % — ABNORMAL LOW (ref 20.0–50.0)
TIBC: 333.2 ug/dL (ref 250.0–450.0)
Transferrin: 238 mg/dL (ref 212.0–360.0)

## 2023-03-25 LAB — PROTIME-INR
INR: 1.1 ratio — ABNORMAL HIGH (ref 0.8–1.0)
Prothrombin Time: 11.6 s (ref 9.6–13.1)

## 2023-03-25 LAB — IRON: Iron: 65 ug/dL (ref 42–145)

## 2023-03-25 NOTE — Progress Notes (Signed)
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  Patient tells me that she was having problems with right upper quadrant abdominal pain.  She was evaluated by surgery.  Negative abdominal ultrasound.  Subsequently underwent hepatobiliary scan with CCK.  Said to have a "low ejection fraction" without cholecystitis.  Was diagnosed with "biliary dyskinesia".  Subsequently underwent laparoscopic cholecystectomy.  Despite this, her right upper quadrant pain persisted.  Patient does have chronic constipation.  She has noted since her cholecystectomy issues with severe upper abdominal pain followed by diarrhea.  She also has a history of chronic reflux for which she takes omeprazole and famotidine daily.  She was told to take this regimen after having issues with cough.  No dysphagia.  She had mildly abnormal liver test for which her PCP recommended mentioning to me, as she already had this appointment secured.  She did undergo colonoscopy in 2014 which is reported as unremarkable.  She is due for follow-up.  Review of blood work from yesterday shows completely normal liver tests except for SGPT of 35 (less than 33).  Previous liver tests were normal.  She did have very mild elevation of amylase and lipase.  She has had no weight loss.  Actually some mild weight gain.  No family history of gastrointestinal malignancy.  He does not use alcohol.  REVIEW OF SYSTEMS:  All non-GI ROS negative unless otherwise stated in the HPI except for sinus and allergy trouble, anxiety, cough, fatigue, depression, headaches, urinary frequency  Past Medical History:  Diagnosis Date   Anxiety    Benign essential HTN    C. difficile diarrhea    Cardiac arrhythmia    Depression    Genital herpes    GERD (gastroesophageal reflux disease)    Heart  murmur    Hypertension    Migraine    Sinusitis     Past Surgical History:  Procedure Laterality Date   NASAL SINUS SURGERY  01/2022    Social History Chelsea Waters  reports that she has never smoked. She has never been exposed to tobacco smoke. She has never used smokeless tobacco. She reports that she does not drink alcohol and does not use drugs.  family history includes Breast cancer in her paternal aunt and paternal uncle; Breast cancer (age of onset: 17) in her mother; Diabetes in her father and mother; Hypertension in her father and mother.  Allergies  Allergen Reactions   Amlodipine Other (See Comments)   Cefadroxil Diarrhea, Nausea And Vomiting and Other (See Comments)       PHYSICAL EXAMINATION: Vital signs: BP 132/80   Pulse 76   Ht 5' 6.5" (1.689 m)   Wt 163 lb (73.9 kg)   LMP 02/05/2011   BMI 25.91 kg/m   Constitutional: generally well-appearing, no acute distress Psychiatric: alert and oriented x3, cooperative Eyes: extraocular movements intact, anicteric, conjunctiva pink Mouth: oral pharynx moist, no lesions Neck: supple no lymphadenopathy Cardiovascular: heart regular rate and rhythm Lungs: clear to auscultation bilaterally Abdomen: soft, nontender, nondistended, no obvious ascites, no peritoneal signs, normal bowel sounds, no organomegaly Rectal: Deferred until colonoscopy Extremities: no clubbing, cyanosis, or lower extremity edema bilaterally Skin: no lesions on visible extremities Neuro: No focal deficits.  Cranial nerves intact  ASSESSMENT:  1.  Right upper quadrant pain postcholecystectomy.  No alarm features. 2.  Very mild elevation of  ALT.  Significance uncertain. 3.  Very mild elevation of amylase and lipase. 4.  Mild GERD 5.  Colon cancer screening.  Average risk.   PLAN:  1.  Supplemental labs regarding mild elevation of hepatic transaminases.  This includes chronic hepatitis serologies, tissue transglutaminase antibody IgA, and  iron studies 2.  Reflux precautions 3.  Continue PPI 4.  Schedule upper endoscopy to evaluate upper abdominal pain and GERD.The nature of the procedure, as well as the risks, benefits, and alternatives were carefully and thoroughly reviewed with the patient. Ample time for discussion and questions allowed. The patient understood, was satisfied, and agreed to proceed. 5.  Schedule screening colonoscopy.The nature of the procedure, as well as the risks, benefits, and alternatives were carefully and thoroughly reviewed with the patient. Ample time for discussion and questions allowed. The patient understood, was satisfied, and agreed to proceed. A total time of 60 minutes was spent preparing to see the patient, reviewing [facility] outside records, obtaining comprehensive history, performing medically appropriate physical examination, counseling and educating the patient regarding the above listed issues, ordering multiple laboratory studies, ordering upper endoscopy, ordering colonoscopy, and documenting clinical information in the health record

## 2023-03-25 NOTE — Patient Instructions (Signed)
Your provider has requested that you go to the basement level for lab work before leaving today. Press "B" on the elevator. The lab is located at the first door on the left as you exit the elevator.  You have been scheduled for an endoscopy and colonoscopy. Please follow the written instructions given to you at your visit today.  Please pick up your prep supplies at the pharmacy within the next 1-3 days.  If you use inhalers (even only as needed), please bring them with you on the day of your procedure.  DO NOT TAKE 7 DAYS PRIOR TO TEST- Trulicity (dulaglutide) Ozempic, Wegovy (semaglutide) Mounjaro (tirzepatide) Bydureon Bcise (exanatide extended release)  DO NOT TAKE 1 DAY PRIOR TO YOUR TEST Rybelsus (semaglutide) Adlyxin (lixisenatide) Victoza (liraglutide) Byetta (exanatide) ___________________________________________________________________________ _______________________________________________________  If your blood pressure at your visit was 140/90 or greater, please contact your primary care physician to follow up on this.  _______________________________________________________  If you are age 70 or older, your body mass index should be between 23-30. Your Body mass index is 25.91 kg/m. If this is out of the aforementioned range listed, please consider follow up with your Primary Care Provider.  If you are age 63 or younger, your body mass index should be between 19-25. Your Body mass index is 25.91 kg/m. If this is out of the aformentioned range listed, please consider follow up with your Primary Care Provider.   ________________________________________________________  The [facility] providers would like to encourage you to use [facility] to communicate with providers for non-urgent requests or questions.  Due to long hold times on the telephone, sending your provider a message by [facility] may be a faster and more efficient way to get a response.  Please allow 48 business  hours for a response.  Please remember that this is for non-urgent requests.  _______________________________________________________

## 2023-03-31 ENCOUNTER — Encounter: Payer: Self-pay | Admitting: Internal Medicine

## 2023-03-31 LAB — HEPATITIS C ANTIBODY: Hepatitis C Ab: NONREACTIVE

## 2023-03-31 LAB — HEPATITIS B SURFACE ANTIBODY,QUALITATIVE: Hep B S Ab: NONREACTIVE

## 2023-03-31 LAB — MITOCHONDRIAL ANTIBODIES: Mitochondrial M2 Ab, IgG: 152.1 U — ABNORMAL HIGH (ref <=20.0)

## 2023-03-31 LAB — TISSUE TRANSGLUTAMINASE, IGA: (tTG) Ab, IgA: <1 U/mL

## 2023-03-31 LAB — HEPATITIS B SURFACE ANTIGEN: Hepatitis B Surface Ag: NONREACTIVE

## 2023-04-01 ENCOUNTER — Other Ambulatory Visit: Payer: Self-pay

## 2023-04-01 ENCOUNTER — Other Ambulatory Visit (INDEPENDENT_AMBULATORY_CARE_PROVIDER_SITE_OTHER): Payer: BC Managed Care – PPO

## 2023-04-01 ENCOUNTER — Encounter: Payer: Self-pay | Admitting: Internal Medicine

## 2023-04-01 DIAGNOSIS — R7989 Other specified abnormal findings of blood chemistry: Secondary | ICD-10-CM

## 2023-04-01 LAB — COMPREHENSIVE METABOLIC PANEL WITH GFR
ALT: 36 U/L — ABNORMAL HIGH (ref 0–35)
AST: 28 U/L (ref 0–37)
Albumin: 4.4 g/dL (ref 3.5–5.2)
Alkaline Phosphatase: 89 U/L (ref 39–117)
BUN: 11 mg/dL (ref 6–23)
CO2: 28 meq/L (ref 19–32)
Calcium: 9.5 mg/dL (ref 8.4–10.5)
Chloride: 101 meq/L (ref 96–112)
Creatinine, Ser: 0.71 mg/dL (ref 0.40–1.20)
GFR: 94.36 mL/min (ref >60.00)
Glucose, Bld: 88 mg/dL (ref 70–99)
Potassium: 3.8 meq/L (ref 3.5–5.1)
Sodium: 137 meq/L (ref 135–145)
Total Bilirubin: 0.6 mg/dL (ref 0.2–1.2)
Total Protein: 6.9 g/dL (ref 6.0–8.3)

## 2023-04-03 LAB — ANTI-NUCLEAR AB-TITER (ANA TITER)
ANA TITER: 1:320 {titer} — ABNORMAL HIGH
ANA Titer 1: 1:320 {titer} — ABNORMAL HIGH

## 2023-04-03 LAB — ANTI-SMOOTH MUSCLE ANTIBODY, IGG: Actin (Smooth Muscle) Antibody (IGG): <20 U

## 2023-04-03 LAB — ANA: Anti Nuclear Antibody (ANA): POSITIVE — AB

## 2023-04-05 ENCOUNTER — Encounter: Payer: Self-pay | Admitting: Internal Medicine

## 2023-04-06 ENCOUNTER — Telehealth: Payer: Self-pay | Admitting: Internal Medicine

## 2023-04-06 NOTE — Telephone Encounter (Signed)
See my chart message

## 2023-04-06 NOTE — Telephone Encounter (Signed)
See additional mychart message.

## 2023-04-06 NOTE — Telephone Encounter (Signed)
Patient called seeking to speak with a nurse regarding her recent lab results is highly concern. Please advise.

## 2023-04-13 ENCOUNTER — Ambulatory Visit (INDEPENDENT_AMBULATORY_CARE_PROVIDER_SITE_OTHER): Payer: BC Managed Care – PPO | Admitting: Internal Medicine

## 2023-04-13 ENCOUNTER — Encounter: Payer: Self-pay | Admitting: Internal Medicine

## 2023-04-13 VITALS — BP 136/80 | HR 75 | Ht 66.5 in | Wt 163.0 lb

## 2023-04-13 DIAGNOSIS — R1011 Right upper quadrant pain: Secondary | ICD-10-CM

## 2023-04-13 DIAGNOSIS — K219 Gastro-esophageal reflux disease without esophagitis: Secondary | ICD-10-CM | POA: Diagnosis not present

## 2023-04-13 DIAGNOSIS — R151 Fecal smearing: Secondary | ICD-10-CM

## 2023-04-13 DIAGNOSIS — R197 Diarrhea, unspecified: Secondary | ICD-10-CM

## 2023-04-13 DIAGNOSIS — Z1211 Encounter for screening for malignant neoplasm of colon: Secondary | ICD-10-CM | POA: Diagnosis not present

## 2023-04-13 DIAGNOSIS — R7989 Other specified abnormal findings of blood chemistry: Secondary | ICD-10-CM

## 2023-04-13 NOTE — Progress Notes (Signed)
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  See that dictation for details.  Colonoscopy and upper endoscopy were recommended.  These have been scheduled for December, during her school break.  Workup of her elevated liver tests revealed elevated autoimmune markers including ANA and AMA.  Repeat liver tests were essentially normal.  She was worried over the lab test abnormalities.  She consulted the Internet was worried about PBC.  Given her concerns, we decided to have her come to the office today for further discussion.  Her only new clinical concern is that of fecal smearing.   REVIEW OF SYSTEMS:  All non-GI ROS negative unless otherwise stated in the HPI except for sinus and allergy, anxiety, cough, depression, fatigue, headaches, heart murmur, urinary frequency, urinary leakage  Past Medical History:  Diagnosis Date   Anxiety    Benign essential HTN    C. difficile diarrhea    Cardiac arrhythmia    Depression    Genital herpes    GERD (gastroesophageal reflux disease)    Heart murmur    Hypertension    Migraine    Sinusitis     Past Surgical History:  Procedure Laterality Date   NASAL SINUS SURGERY  01/2022    Social History Chelsea Waters  reports that she has never smoked. She has never been exposed to tobacco smoke. She has never used smokeless tobacco. She reports that she does not drink alcohol and does not use drugs.  family history includes Breast cancer in her paternal aunt and paternal uncle; Breast cancer (age of onset: 49) in her mother; Diabetes in her father and mother; Hypertension in her father and mother.  Allergies  Allergen Reactions   Amlodipine Other (See Comments)    Cefadroxil Diarrhea, Nausea And Vomiting and Other (See Comments)       PHYSICAL EXAMINATION: Vital signs: BP 136/80   Pulse 75   Ht 5' 6.5" (1.689 m)   Wt 163 lb (73.9 kg)   LMP 02/05/2011   BMI 25.91 kg/m  General: Well-developed, well-nourished, no acute distress HEENT: Anicteric Abdomen: Not reexamined. Extremities: No visible abnormalities Psychiatric: alert and oriented x3. Cooperative  ASSESSMENT:  1.  Elevated AMA and ANA with essentially normal liver tests.  Normal hepatic synthetic function.  Query underlying PBC.  Query other causes or autoimmune marker elevation.  Query abnormal test without clinical consequence. 2.  Chronic right upper quadrant pain postcholecystectomy.  Ongoing 3.  Mild GERD 4.  Colon cancer screening 5.  Fecal leakage   PLAN:  1.  Referred to [facility] for opinion regarding lab test abnormalities. 2.  Recommend Citrucel 2 tablespoons daily to see if we can improve her bowel habits and reduce episodes of fecal leakage 3.  Keep plans for upper endoscopy regarding abdominal pain. 4.  Keep plans for colonoscopy for colon cancer screening. A total time of 30 minutes was spent preparing to see the patient, reviewing a myriad of laboratory studies, obtaining interval history, performing medically appropriate physical exam, counseling and educating the patient regarding above listed issues, directing symptomatic therapy, arranging hepatology consultation, and documenting clinical information in the health record

## 2023-04-13 NOTE — Patient Instructions (Signed)
Take 2 tablespoons of Citrucel daily in water or juice.  I will send a referral to [facility] - they will contact you to set up an appointment.  _______________________________________________________  If your blood pressure at your visit was 140/90 or greater, please contact your primary care physician to follow up on this.  _______________________________________________________  If you are age 57 or older, your body mass index should be between 23-30. Your Body mass index is 25.91 kg/m. If this is out of the aforementioned range listed, please consider follow up with your Primary Care Provider.  If you are age 32 or younger, your body mass index should be between 19-25. Your Body mass index is 25.91 kg/m. If this is out of the aformentioned range listed, please consider follow up with your Primary Care Provider.   ________________________________________________________  The [place] [facility] providers would like to encourage you to use [facility] to communicate with providers for non-urgent requests or questions.  Due to long hold times on the telephone, sending your provider a message by [facility] may be a faster and more efficient way to get a response.  Please allow 48 business hours for a response.  Please remember that this is for non-urgent requests.  _______________________________________________________

## 2023-04-14 ENCOUNTER — Encounter: Payer: Self-pay | Admitting: Internal Medicine

## 2023-06-29 ENCOUNTER — Ambulatory Visit: Payer: BC Managed Care – PPO

## 2023-06-29 VITALS — Ht 67.0 in | Wt 158.0 lb

## 2023-06-29 DIAGNOSIS — R1011 Right upper quadrant pain: Secondary | ICD-10-CM

## 2023-06-29 MED ORDER — NA SULFATE-K SULFATE-MG SULF 17.5-3.13-1.6 GM/177ML PO SOLN
1.0000 | Freq: Once | ORAL | 0 refills | Status: AC
Start: 2023-06-29 — End: 2023-06-29

## 2023-06-29 NOTE — Progress Notes (Signed)

## 2023-06-30 ENCOUNTER — Encounter: Payer: Self-pay | Admitting: Internal Medicine

## 2023-07-10 ENCOUNTER — Encounter: Payer: BC Managed Care – PPO | Admitting: Internal Medicine

## 2023-08-18 ENCOUNTER — Encounter: Payer: BC Managed Care – PPO | Admitting: Internal Medicine

## 2023-09-18 ENCOUNTER — Encounter: Payer: BC Managed Care – PPO | Admitting: Internal Medicine

## 2023-10-13 ENCOUNTER — Ambulatory Visit (AMBULATORY_SURGERY_CENTER)

## 2023-10-13 VITALS — Ht 67.0 in | Wt 160.0 lb

## 2023-10-13 DIAGNOSIS — Z1211 Encounter for screening for malignant neoplasm of colon: Secondary | ICD-10-CM

## 2023-10-13 DIAGNOSIS — R1013 Epigastric pain: Secondary | ICD-10-CM

## 2023-10-13 NOTE — Progress Notes (Signed)
 No egg or soy allergy known to patient  No issues known to pt with past sedation with any surgeries or procedures Patient denies ever being told they had issues or difficulty with intubation  No FH of Malignant Hyperthermia Pt is not on diet pills nor GLP-1 medications Pt is not on  home 02  Pt is not on blood thinners  Pt states issues with chronic constipation  No A fib or A flutter Have any cardiac testing pending--no Ambulates independently

## 2023-10-23 ENCOUNTER — Encounter: Payer: Self-pay | Admitting: Internal Medicine

## 2023-10-26 ENCOUNTER — Telehealth: Payer: Self-pay | Admitting: Internal Medicine

## 2023-10-26 NOTE — Telephone Encounter (Signed)
 Inbound call from patient stating that she is scheduled for a colonoscopy and endoscopy for tomorrow at 12/:30 with Dr. Elvin Hammer. Patient is requesting a call because she does not like getting IV's because she has bad veins. Patient is wanting to know if we have " freeze spray" to spray on before the IV is placed and if not, can she bring her own. Please advise.

## 2023-10-27 ENCOUNTER — Encounter: Payer: Self-pay | Admitting: Internal Medicine

## 2023-10-27 ENCOUNTER — Ambulatory Visit: Admitting: Internal Medicine

## 2023-10-27 VITALS — BP 161/75 | HR 55 | Temp 99.0°F | Resp 12 | Ht 67.0 in | Wt 160.0 lb

## 2023-10-27 DIAGNOSIS — D125 Benign neoplasm of sigmoid colon: Secondary | ICD-10-CM | POA: Diagnosis not present

## 2023-10-27 DIAGNOSIS — K219 Gastro-esophageal reflux disease without esophagitis: Secondary | ICD-10-CM

## 2023-10-27 DIAGNOSIS — Z1211 Encounter for screening for malignant neoplasm of colon: Secondary | ICD-10-CM | POA: Diagnosis present

## 2023-10-27 DIAGNOSIS — R1011 Right upper quadrant pain: Secondary | ICD-10-CM

## 2023-10-27 DIAGNOSIS — R1013 Epigastric pain: Secondary | ICD-10-CM

## 2023-10-27 DIAGNOSIS — D128 Benign neoplasm of rectum: Secondary | ICD-10-CM

## 2023-10-27 DIAGNOSIS — K621 Rectal polyp: Secondary | ICD-10-CM

## 2023-10-27 MED ORDER — SODIUM CHLORIDE 0.9 % IV SOLN: 500.0000 mL | Freq: Once | INTRAVENOUS | Status: DC

## 2023-10-27 NOTE — Op Note (Signed)
 [facility] Patient Name: Chelsea Waters Procedure Date: 10/27/2023 1:35 PM MRN: [id number] Endoscopist: Wilhemina Bonito. Marina Goodell , MD, [id number] Age: 57 Referring MD:  Date of Birth: [birth year] Gender: Female Account #: [id number] Procedure:                Upper GI endoscopy Indications:              Abdominal pain/sensation in the right upper                            quadrant. Status post prior cholecystectomy Medicines:                Monitored Anesthesia Care Procedure:                Pre-Anesthesia Assessment:                           - Prior to the procedure, a History and Physical                            was performed, and patient medications and                            allergies were reviewed. The patient's tolerance of                            previous anesthesia was also reviewed. The risks                            and benefits of the procedure and the sedation                            options and risks were discussed with the patient.                            All questions were answered, and informed consent                            was obtained. Prior Anticoagulants: The patient has                            taken no anticoagulant or antiplatelet agents. ASA                            Grade Assessment: II - A patient with mild systemic                            disease. After reviewing the risks and benefits,                            the patient was deemed in satisfactory condition to                            undergo the procedure.  After obtaining informed consent, the endoscope was                            passed under direct vision. Throughout the                            procedure, the patient's blood pressure, pulse, and                            oxygen saturations were monitored continuously. The                            GIF [id number] #[id number] was introduced through the                            mouth, and advanced  to the second part of duodenum.                            The upper GI endoscopy was accomplished without                            difficulty. The patient tolerated the procedure                            well. Scope In: Scope Out: Findings:                 The esophagus was normal.                           The stomach was normal.                           The examined duodenum was normal.                           The cardia and gastric fundus were normal on                            retroflexion. Complications:            No immediate complications. Estimated Blood Loss:     Estimated blood loss: none. Impression:               - Normal esophagus.                           - Normal stomach.                           - Normal examined duodenum.                           - No specimens collected. Recommendation:           - Patient has a contact number available for  emergencies. The signs and symptoms of potential                            delayed complications were discussed with the                            patient. Return to normal activities tomorrow.                            Written discharge instructions were provided to the                            patient.                           - Resume previous diet.                           - Continue present medications. Murel Arlington. Elvin Hammer, MD 10/27/2023 2:12:24 PM This report has been signed electronically.

## 2023-10-27 NOTE — Progress Notes (Signed)
 Called to room to assist during endoscopic procedure.  Patient ID and intended procedure confirmed with present staff. Received instructions for my participation in the procedure from the performing physician.

## 2023-10-27 NOTE — Progress Notes (Signed)
 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  See that dictation for details.   Colonoscopy and upper endoscopy were recommended.  These have been scheduled for December, during her school break.   Workup of her elevated liver tests revealed elevated autoimmune markers including ANA and AMA.  Repeat liver tests were essentially normal.  She was worried over the lab test abnormalities.  She consulted the Internet was worried about PBC.  Given her concerns, we decided to have her come to the office today for further discussion.   Her only new clinical concern is that of fecal smearing.     REVIEW OF SYSTEMS:   All non-GI ROS negative unless otherwise stated in the HPI except for sinus and allergy, anxiety, cough, depression, fatigue, headaches, heart murmur, urinary frequency, urinary leakage       Past Medical History:  Diagnosis Date   Anxiety     Benign essential HTN     C. difficile diarrhea     Cardiac arrhythmia     Depression     Genital herpes     GERD (gastroesophageal reflux disease)     Heart murmur     Hypertension     Migraine     Sinusitis                 Past Surgical History:  Procedure Laterality Date   NASAL SINUS SURGERY   01/2022          Social History Chelsea Waters  reports that she has never smoked. She has never been exposed to tobacco smoke. She has never used smokeless tobacco. She reports that she does not drink alcohol and does not use drugs.   family history includes Breast cancer in her paternal aunt and paternal uncle; Breast cancer (age of onset: 42) in her mother; Diabetes in her father and mother; Hypertension in her father and mother.    Allergies      Allergies  Allergen Reactions   Amlodipine Other (See Comments)   Cefadroxil Diarrhea, Nausea And Vomiting and Other (See Comments)            PHYSICAL EXAMINATION: Vital signs: BP 136/80   Pulse 75   Ht 5' 6.5" (1.689 m)   Wt 163 lb (73.9 kg)   LMP 02/05/2011   BMI 25.91 kg/m  General: Well-developed, well-nourished, no acute distress HEENT: Anicteric Abdomen: Not reexamined. Extremities: No visible abnormalities Psychiatric: alert and oriented x3. Cooperative   ASSESSMENT:   1.  Elevated AMA and ANA with essentially normal liver tests.  Normal hepatic synthetic function.  Query underlying PBC.  Query other causes or autoimmune marker elevation.  Query abnormal test without clinical consequence. 2.  Chronic right upper quadrant pain postcholecystectomy.  Ongoing 3.  Mild GERD 4.  Colon cancer screening 5.  Fecal leakage     PLAN:   1.  Referred to [facility] for opinion regarding lab test abnormalities. 2.  Recommend Citrucel 2 tablespoons daily to see if we can improve her bowel habits and reduce episodes of fecal leakage 3.  Keep plans for upper endoscopy regarding abdominal pain. 4.  Keep plans for colonoscopy for colon cancer screening.   Recent H&P as above.  No interval change.  She has been seen by [facility] hepatology.  Not for screening colonoscopy and upper endoscopy to evaluate chronic right upper quadrant pain.

## 2023-10-27 NOTE — Progress Notes (Signed)
 Sedate, gd SR, tolerated procedure well, VSS, report to RN

## 2023-10-27 NOTE — Patient Instructions (Addendum)
 Continue regular diet Continue regular medications Await pathology results  Handout given for colon polyps  YOU HAD AN ENDOSCOPIC PROCEDURE TODAY AT THE [facility]:   Refer to the procedure report that was given to you for any specific questions about what was found during the examination.  If the procedure report does not answer your questions, please call your gastroenterologist to clarify.  If you requested that your care partner not be given the details of your procedure findings, then the procedure report has been included in a sealed envelope for you to review at your convenience later.  YOU SHOULD EXPECT: Some feelings of bloating in the abdomen. Passage of more gas than usual.  Walking can help get rid of the air that was put into your GI tract during the procedure and reduce the bloating. If you had a lower endoscopy (such as a colonoscopy or flexible sigmoidoscopy) you may notice spotting of blood in your stool or on the toilet paper. If you underwent a bowel prep for your procedure, you may not have a normal bowel movement for a few days.  Please Note:  You might notice some irritation and congestion in your nose or some drainage.  This is from the oxygen used during your procedure.  There is no need for concern and it should clear up in a day or so.  SYMPTOMS TO REPORT IMMEDIATELY:  Following lower endoscopy (colonoscopy or flexible sigmoidoscopy):  Excessive amounts of blood in the stool  Significant tenderness or worsening of abdominal pains  Swelling of the abdomen that is new, acute  Fever of 100F or higher Following upper endoscopy (EGD)  Vomiting of blood or coffee ground material  New chest pain or pain under the shoulder blades  Painful or persistently difficult swallowing  New shortness of breath  Black, tarry-looking stools  For urgent or emergent issues, a gastroenterologist can be reached at any hour by calling [phone]. Do not use MyChart  messaging for urgent concerns.   DIET:  We do recommend a small meal at first, but then you may proceed to your regular diet.  Drink plenty of fluids but you should avoid alcoholic beverages for 24 hours.  ACTIVITY:  You should plan to take it easy for the rest of today and you should NOT DRIVE or use heavy machinery until tomorrow (because of the sedation medicines used during the test).    FOLLOW UP: Our staff will call the number listed on your records the next business day following your procedure.  We will call around 7:15- 8:00 am to check on you and address any questions or concerns that you may have regarding the information given to you following your procedure. If we do not reach you, we will leave a message.     If any biopsies were taken you will be contacted by phone or by letter within the next 1-3 weeks.  Please call us at [phone] if you have not heard about the biopsies in 3 weeks.   SIGNATURES/CONFIDENTIALITY: You and/or your care partner have signed paperwork which will be entered into your electronic medical record.  These signatures attest to the fact that that the information above on your After Visit Summary has been reviewed and is understood.  Full responsibility of the confidentiality of this discharge information lies with you and/or your care-partner.

## 2023-10-27 NOTE — Op Note (Signed)
 [facility] Patient Name: Chelsea Waters Procedure Date: 10/27/2023 1:37 PM MRN: [id number] Endoscopist: Wilhemina Bonito. Marina Goodell , MD, [id number] Age: 58 Referring MD:  Date of Birth: 10/20/1965 Gender: Female Account #: [id number] Procedure:                Colonoscopy with cold snare polypectomy x 1; biopsy                            polypectomy x 1 Indications:              Screening for colorectal malignant neoplasm.                            Reports unremarkable colonoscopy 2014 in [place] Medicines:                Monitored Anesthesia Care Procedure:                Pre-Anesthesia Assessment:                           - Prior to the procedure, a History and Physical                            was performed, and patient medications and                            allergies were reviewed. The patient's tolerance of                            previous anesthesia was also reviewed. The risks                            and benefits of the procedure and the sedation                            options and risks were discussed with the patient.                            All questions were answered, and informed consent                            was obtained. Prior Anticoagulants: The patient has                            taken no anticoagulant or antiplatelet agents. ASA                            Grade Assessment: II - A patient with mild systemic                            disease. After reviewing the risks and benefits,                            the patient was deemed in satisfactory condition to  undergo the procedure.                           After obtaining informed consent, the colonoscope                            was passed under direct vision. Throughout the                            procedure, the patient's blood pressure, pulse, and                            oxygen saturations were monitored continuously. The                            CF [id number]  #[id number] was introduced through the anus                            and advanced to the the cecum, identified by                            appendiceal orifice and ileocecal valve. The                            ileocecal valve, appendiceal orifice, and rectum                            were photographed. The quality of the bowel                            preparation was excellent. The colonoscopy was                            performed without difficulty. The patient tolerated                            the procedure well. The bowel preparation used was                            SUPREP via split dose instruction. Scope In: 1:40:26 PM Scope Out: 1:58:23 PM Scope Withdrawal Time: 0 hours 13 minutes 10 seconds  Total Procedure Duration: 0 hours 17 minutes 57 seconds  Findings:                 A 5 mm polyp was found in the sigmoid colon. The                            polyp was sessile. The polyp was removed with a                            cold snare. Resection and retrieval were complete.                           A 1 mm polyp was found in the  rectum. The polyp was                            removed with a jumbo cold forceps. Resection and                            retrieval were complete.                           The exam was otherwise without abnormality on                            direct and retroflexion views. Complications:            No immediate complications. Estimated blood loss:                            None. Estimated Blood Loss:     Estimated blood loss: none. Impression:               - One 5 mm polyp in the sigmoid colon, removed with                            a cold snare. Resected and retrieved.                           - One 1 mm polyp in the rectum, removed with a                            jumbo cold forceps. Resected and retrieved.                           - The examination was otherwise normal on direct                            and retroflexion  views. Recommendation:           - Repeat colonoscopy in 7-10 years for surveillance.                           - Patient has a contact number available for                            emergencies. The signs and symptoms of potential                            delayed complications were discussed with the                            patient. Return to normal activities tomorrow.                            Written discharge instructions were provided to the                            patient.                           -  Resume previous diet.                           - Continue present medications.                           - Await pathology results. Murel Arlington. Elvin Hammer, MD 10/27/2023 2:09:00 PM This report has been signed electronically.

## 2023-10-28 ENCOUNTER — Telehealth: Payer: Self-pay

## 2023-10-28 NOTE — Telephone Encounter (Signed)
  Follow up Call-     10/27/2023    1:05 PM  Call back number  Post procedure Call Back phone  # [phone]  Permission to leave phone message Yes     Patient questions:  Do you have a fever, pain , or abdominal swelling? No Pain Score  0 *  Have you tolerated food without any problems? Yes  Have you been able to return to your normal activities? Yes.    Do you have any questions about your discharge instructions: Diet   No. Medications  No. Follow up visit  No.  Do you have questions or concerns about your Care? No.  Actions: * If pain score is 4 or above: No action needed, pain <4.

## 2023-10-30 LAB — SURGICAL PATHOLOGY

## 2023-11-01 ENCOUNTER — Encounter: Payer: Self-pay | Admitting: Internal Medicine

## 2024-03-07 ENCOUNTER — Other Ambulatory Visit: Payer: Self-pay | Admitting: Family

## 2024-03-07 DIAGNOSIS — Z1231 Encounter for screening mammogram for malignant neoplasm of breast: Secondary | ICD-10-CM

## 2024-03-22 ENCOUNTER — Ambulatory Visit
Admission: RE | Admit: 2024-03-22 | Discharge: 2024-03-22 | Disposition: A | Discharge status: Home / Self Care | Source: Ambulatory Visit | Attending: Family | Admitting: Family

## 2024-03-22 DIAGNOSIS — Z1231 Encounter for screening mammogram for malignant neoplasm of breast: Secondary | ICD-10-CM
